# Patient Record
Sex: Male | Born: 1965 | Hispanic: No | Marital: Married
Health system: Southern US, Community
[De-identification: ages and names within clinical notes are randomized; demographics above are authoritative.]

---

## 2004-11-01 ENCOUNTER — Ambulatory Visit: Payer: Self-pay | Admitting: Internal Medicine

## 2004-11-01 IMAGING — US ABDOMEN ULTRASOUND
1 series · 14 of 25 positions shown · non-contrast
Comparison: none

REASON FOR EXAM: RUQ Pain
COMMENTS:

[Series 1: abdomen ultrasound · 0.39mm/px · 14 of 65 slices shown]
[im 1/65]
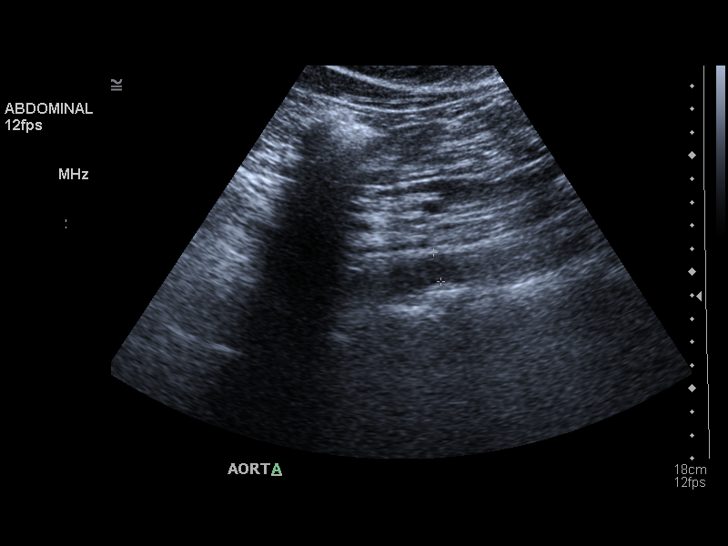
[im 6/65]
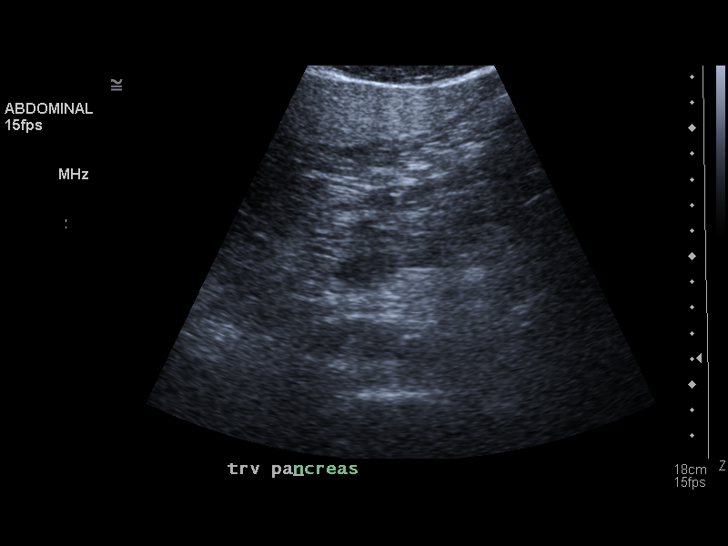
[im 11/65]
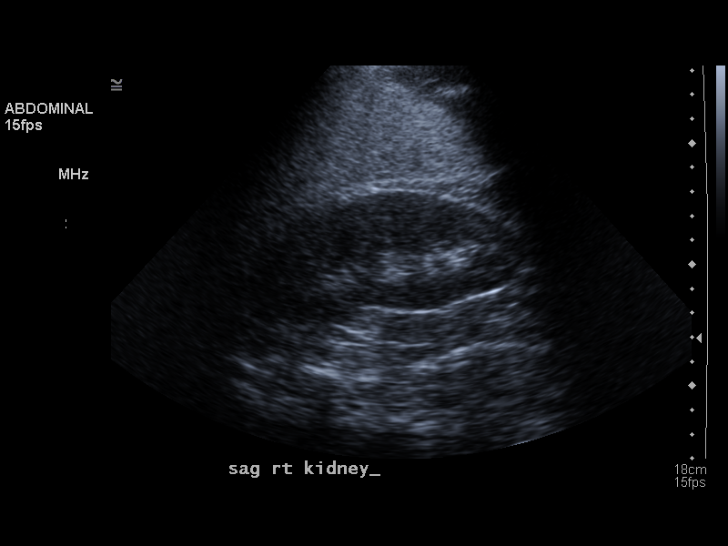
[im 17/65]
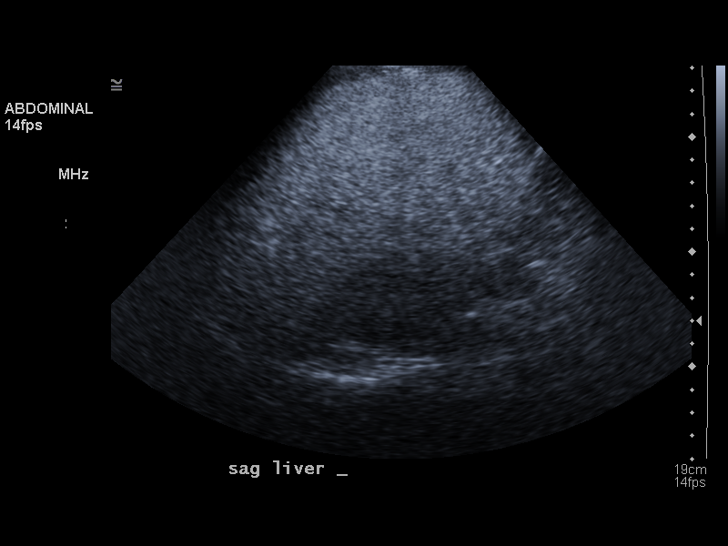
[im 22/65]
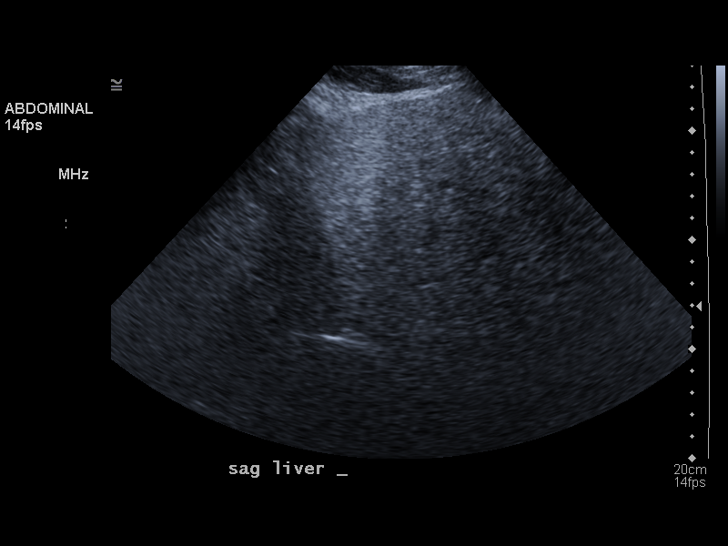
[im 25/65]
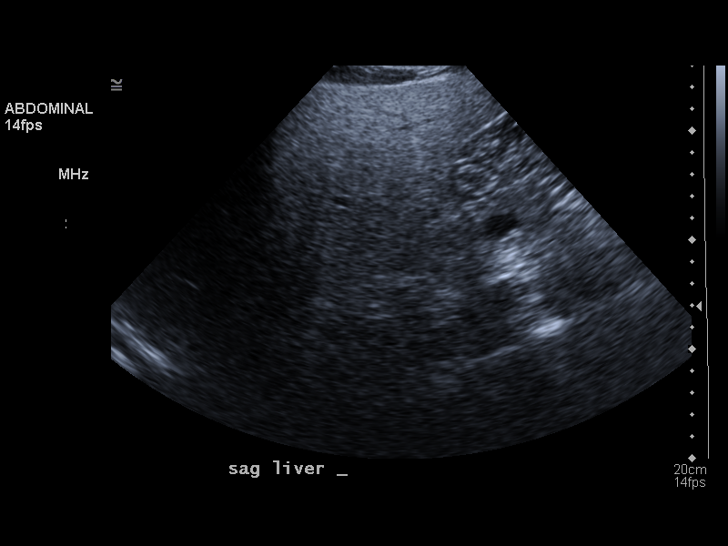
[im 30/65]
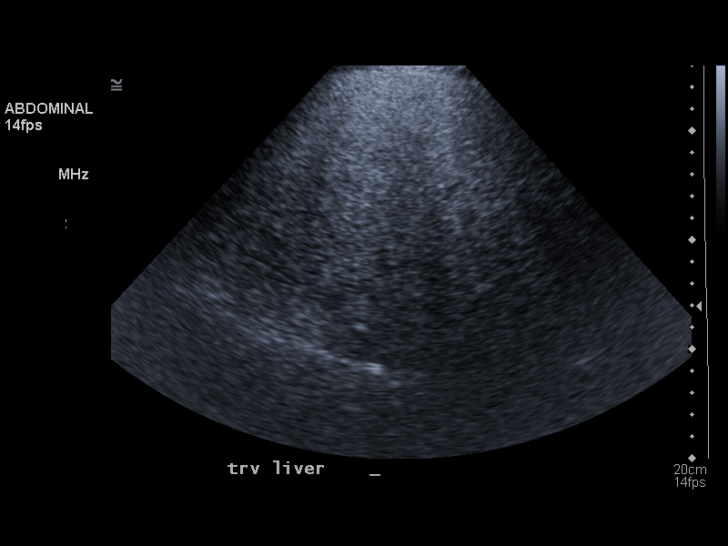
[im 35/65]
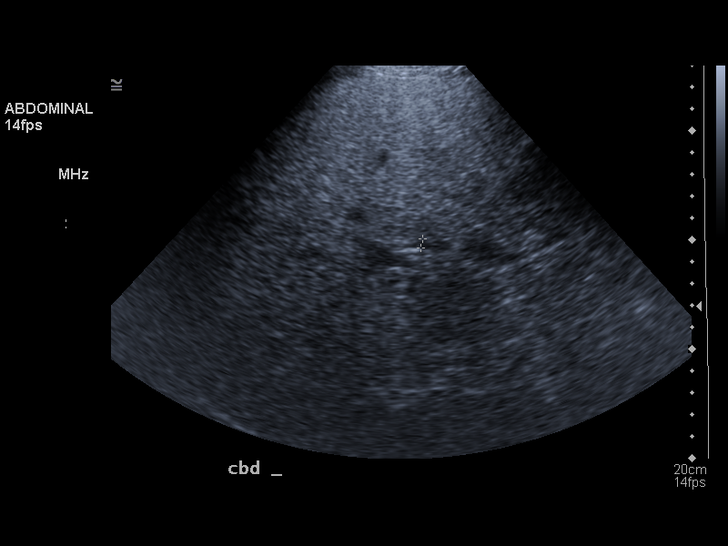
[im 41/65]
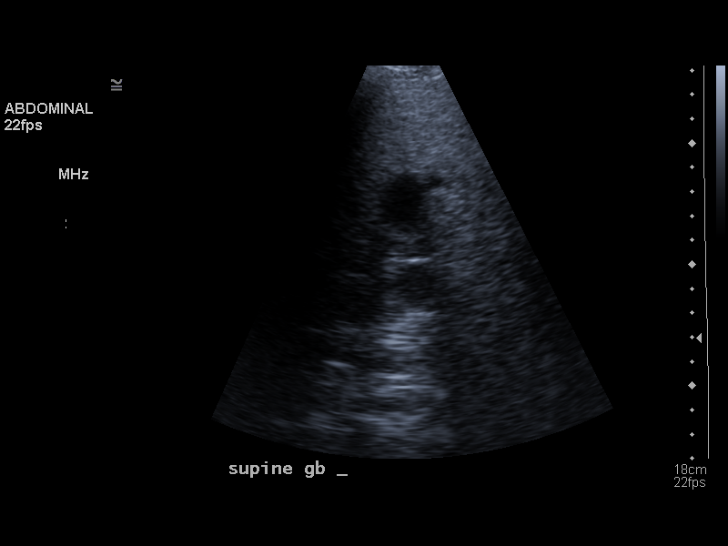
[im 43/65]
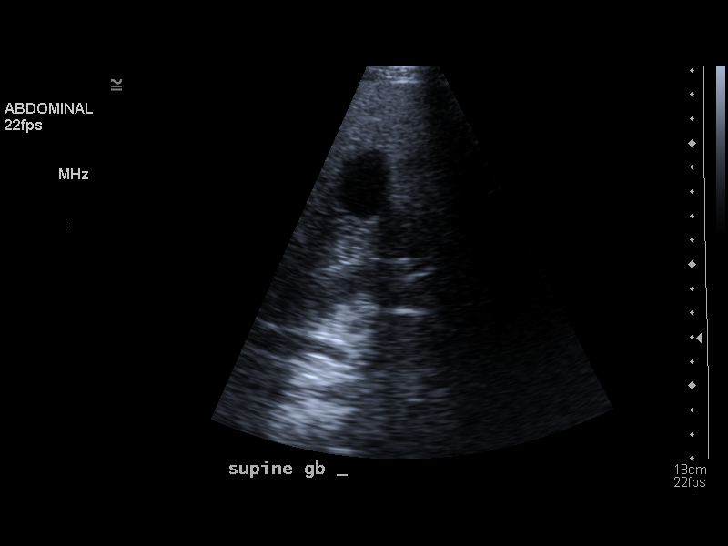
[im 49/65]
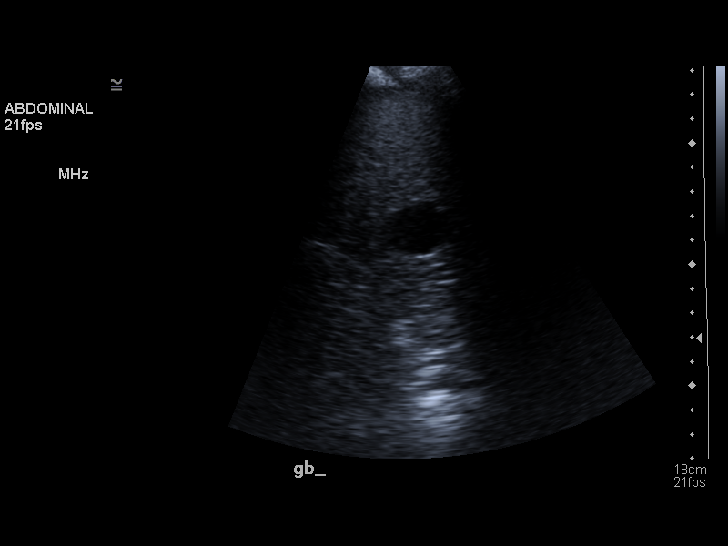
[im 54/65]
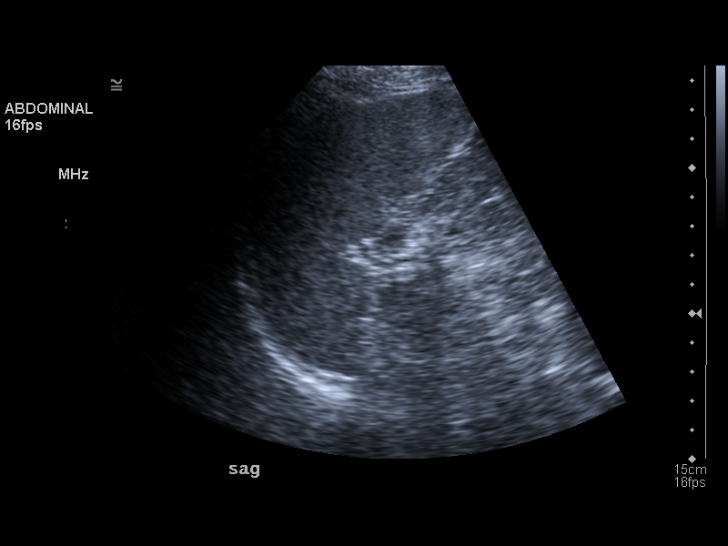
[im 59/65]
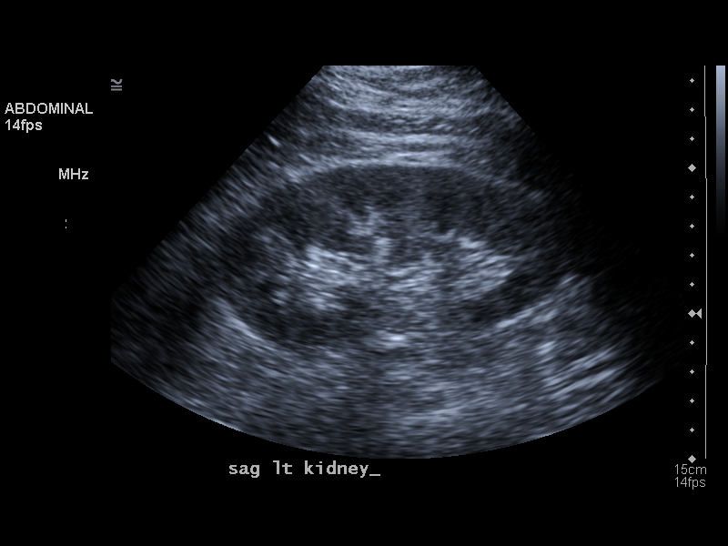
[im 65/65]
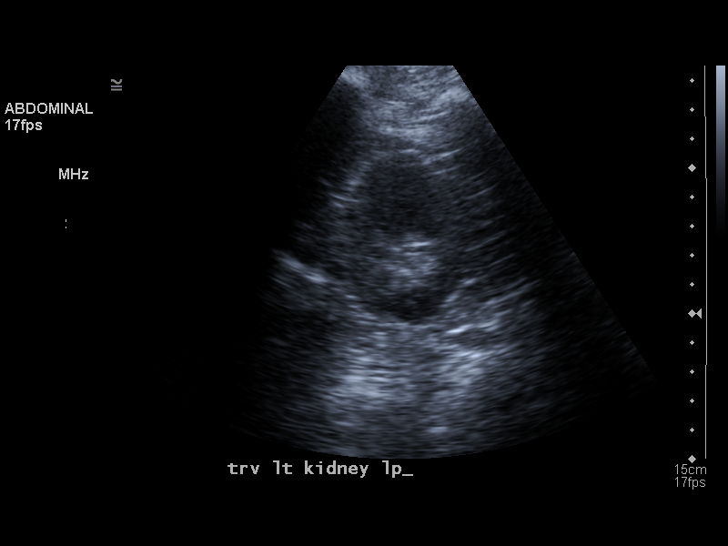

[14 of 25 positions shown; findings below may reference images not displayed]

PROCEDURE:     US  - US ABDOMEN GENERAL SURVEY  - [DATE]  [DATE]

RESULT:     The liver shows increased echogenicity consistent with fatty
infiltration. The spleen size is normal. The pancreatic tail is partially
obscured but the pancreas otherwise is normal in appearance. No gallstones
are seen. There is no thickening of the gallbladder wall. The common bile
duct measures 4.2 mm in diameter which is within normal limits. The kidneys
show no hydronephrosis. There is no ascites.
IMPRESSION: 1.     The liver shows increased echogenicity consistent with fatty
infiltration.
2.     No gallstones are seen.
3.     The pancreas is not optimally visualized in this exam but the
visualized portions are normal in appearance.

## 2008-12-31 ENCOUNTER — Ambulatory Visit: Payer: Self-pay | Admitting: General Surgery

## 2009-01-08 ENCOUNTER — Ambulatory Visit: Payer: Self-pay | Admitting: General Surgery

## 2010-11-02 ENCOUNTER — Emergency Department: Payer: Self-pay | Admitting: Emergency Medicine

## 2010-11-02 IMAGING — US ABDOMEN ULTRASOUND
1 series · 13 of 25 positions shown · non-contrast
Comparison: none

REASON FOR EXAM: epigastric pain
COMMENTS:   LMP: (Male)

[Series 1: abdomen ultrasound · 0.41mm/px · 13 of 88 slices shown]
[im 1/88]
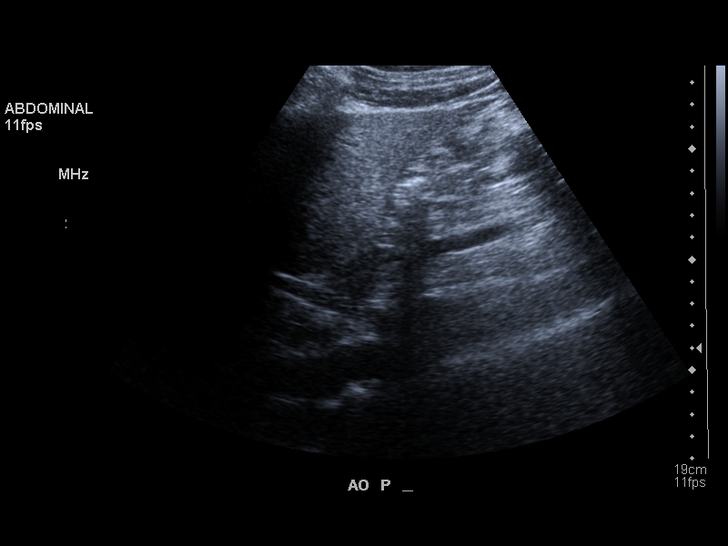
[im 8/88]
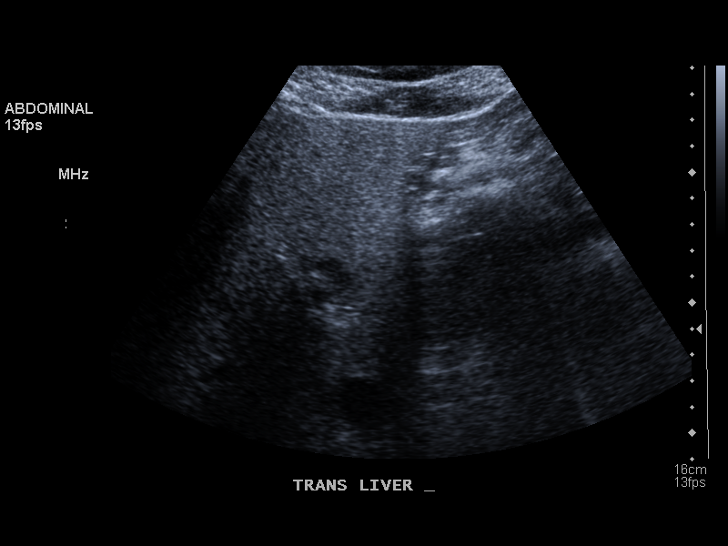
[im 15/88]
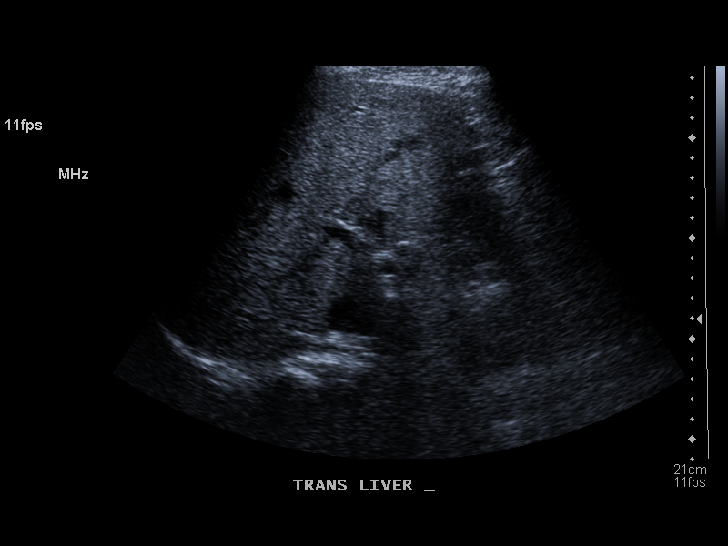
[im 22/88]
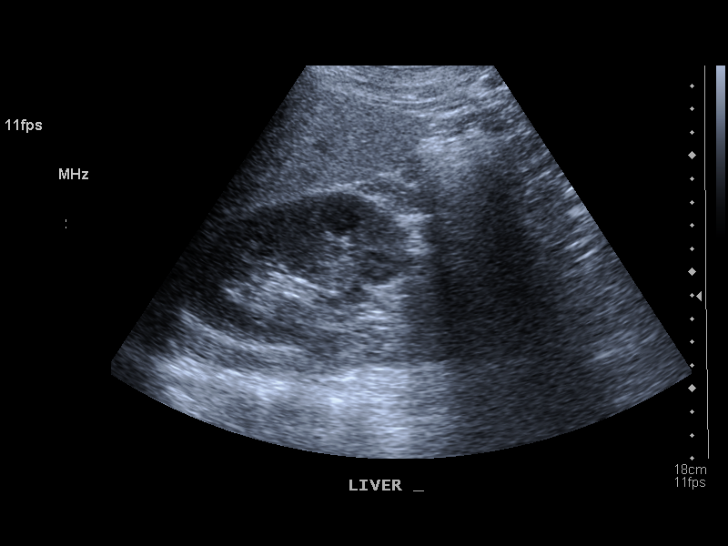
[im 30/88]
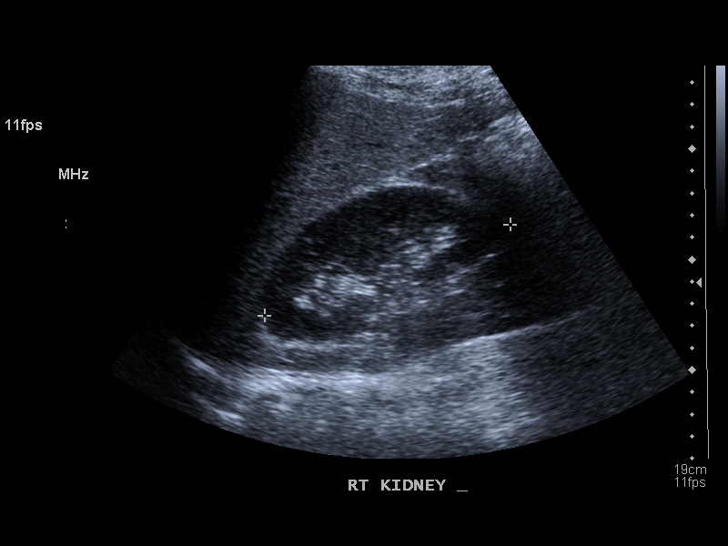
[im 37/88]
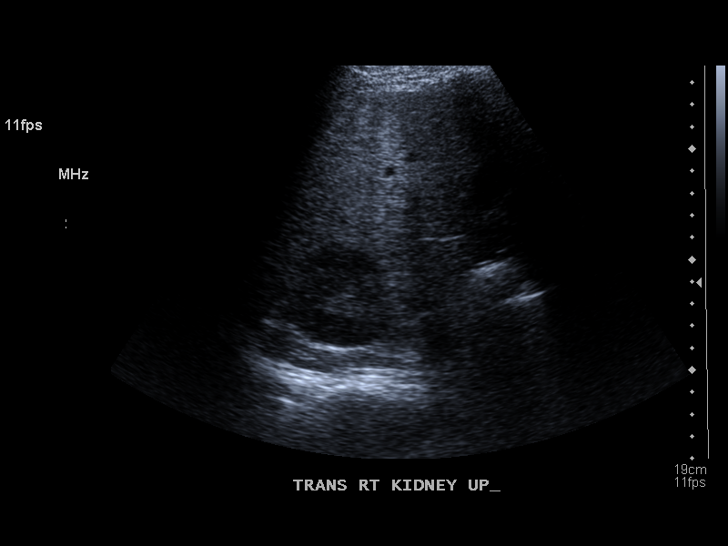
[im 44/88]
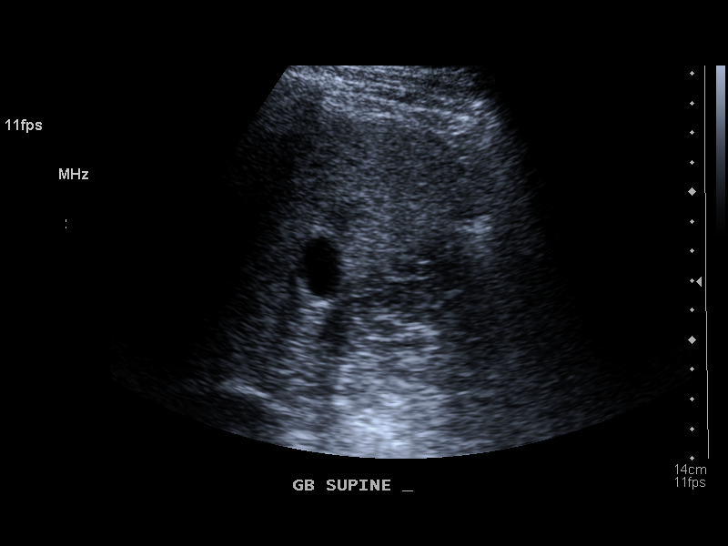
[im 51/88]
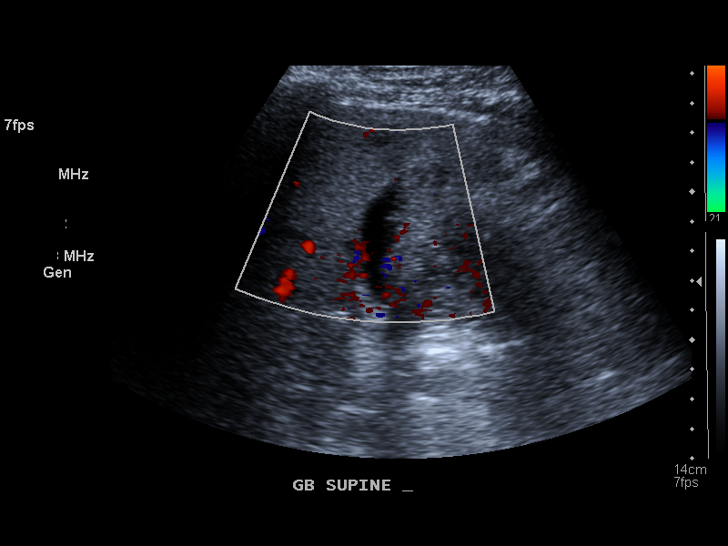
[im 59/88]
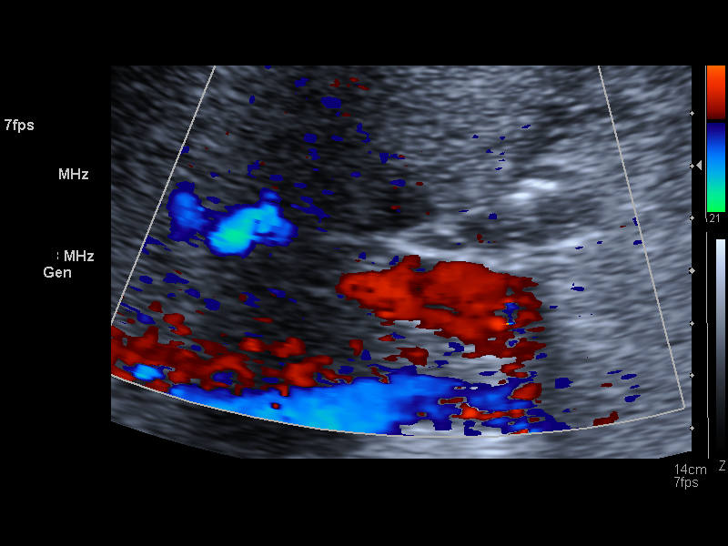
[im 66/88]
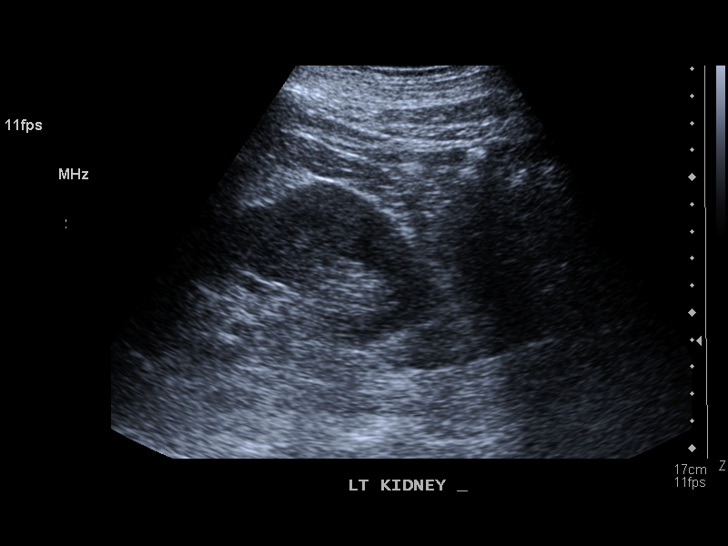
[im 73/88]
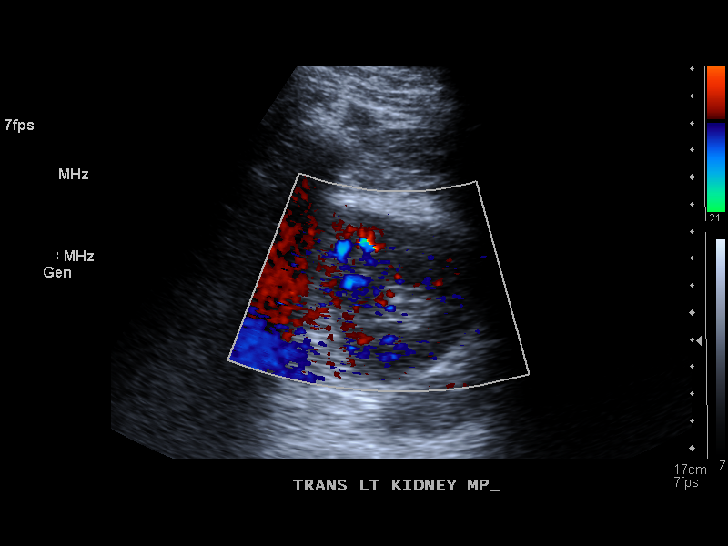
[im 80/88]
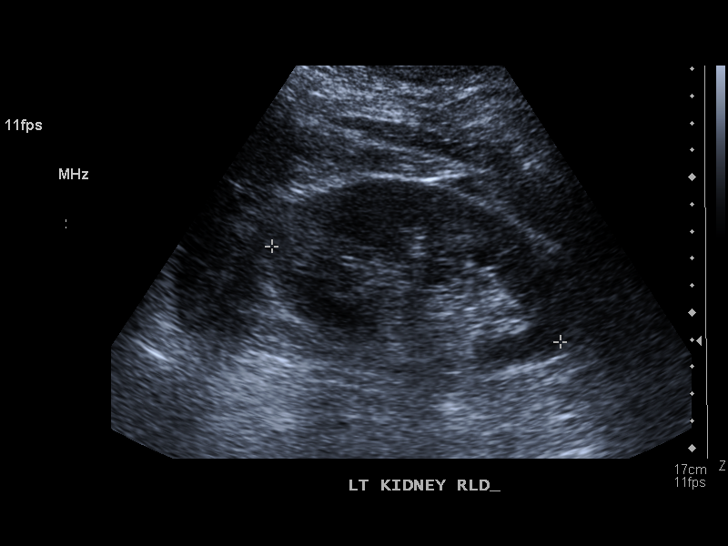
[im 88/88]
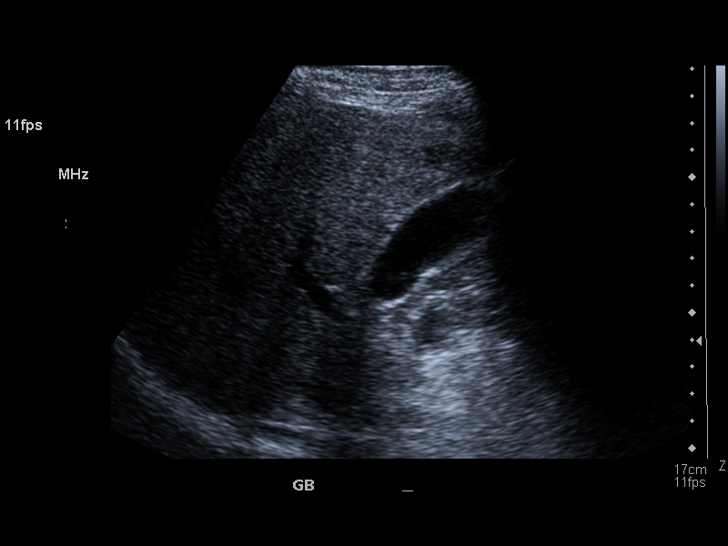

[13 of 25 positions shown; findings below may reference images not displayed]

PROCEDURE:     US  - US ABDOMEN GENERAL SURVEY  - [DATE]  [DATE]

RESULT:     The liver is hyperechogenic, suspicious for infiltration. No
focal hepatic mass lesions are seen. The pancreas is not visualized
adequately for evaluation on this exam. The abdominal aorta is for the most
part obscured. The inferior vena cava shows no significant abnormalities.
Spleen size is normal. No gallstones are seen. There is no thickening of the
gallbladder wall. There is a nonmobile nonshadowing echodensity associated
with the gallbladder wall and likely representing a gallbladder polyp that
measures 6.1 mm in diameter. There is no thickening of the gallbladder wall.
The common bile duct measures 3.1 mm in diameter which is within normal
limits. The kidneys show no hydronephrosis. Sagittally, the right kidney
measures 12.82 cm and the left measures 11.03 cm. No ascites is seen.
IMPRESSION: 1. No gallstones are seen.
2. Probable gallbladder polyp measuring 6.1 mm at maximum diameter.
3. The pancreas and much of the abdominal aorta are obscured by bowel gas
and not visualized adequately for evaluation on this exam.

## 2015-12-27 ENCOUNTER — Telehealth: Payer: Self-pay | Admitting: Cardiovascular Disease

## 2015-12-27 NOTE — Telephone Encounter (Signed)
Patient says he is currently taking Norvasc 10 mg daily po ; Pravastatin  10 mg po daily diovan 320 mg po daily  and asa 325 po daily

## 2015-12-27 NOTE — Telephone Encounter (Signed)
Patient called to schedule new patient ph appt with Dr. Mariah Millinggollan .  Patient says he is a friend of Erline HauChap Mcqueen and was told by him to see Dr. Mariah MillingGollan asap.  Scheduled patient for new patient appt on Wednesday at 2 pm but patient wanted to ask if he can be worked in any sooner.  Requested patient come by the office to fill out roi to get hospital records from hospital and from pcp.  Patient says he was seen for low na+ and tia.  Patient also c/o htn .  BP is 155/101.

## 2015-12-28 NOTE — Telephone Encounter (Signed)
Pt called back to cancel appt.  He advised scheduling that he has a "friend who is a cardiologist in [place] that can see him sooner".

## 2015-12-28 NOTE — Telephone Encounter (Signed)
Attempted to contact pt.  No answer, voice mail box is full.  

## 2015-12-28 NOTE — Telephone Encounter (Signed)
Can mandi call to get info

## 2015-12-29 ENCOUNTER — Ambulatory Visit: Payer: Self-pay | Admitting: Cardiovascular Disease

## 2017-01-03 ENCOUNTER — Other Ambulatory Visit: Payer: Self-pay | Admitting: Unknown Physician Specialty

## 2017-01-03 DIAGNOSIS — R059 Cough, unspecified: Secondary | ICD-10-CM

## 2017-01-03 DIAGNOSIS — R05 Cough: Secondary | ICD-10-CM

## 2017-01-04 ENCOUNTER — Ambulatory Visit
Admission: RE | Admit: 2017-01-04 | Discharge: 2017-01-04 | Disposition: A | Discharge status: Home / Self Care | Payer: BLUE CROSS/BLUE SHIELD | Source: Ambulatory Visit | Attending: Unknown Physician Specialty | Admitting: Unknown Physician Specialty

## 2017-01-04 DIAGNOSIS — R05 Cough: Secondary | ICD-10-CM

## 2017-01-04 DIAGNOSIS — R059 Cough, unspecified: Secondary | ICD-10-CM

## 2017-01-04 IMAGING — CR DG CHEST 2V
1 series · 2 of 2 positions shown · non-contrast
Comparison: None.

CLINICAL DATA: Cough

EXAM:
CHEST  2 VIEW

[Series 1: dg chest 2 view · 0.14mm/px · 2 of 2 slices shown]
[im 1/2]
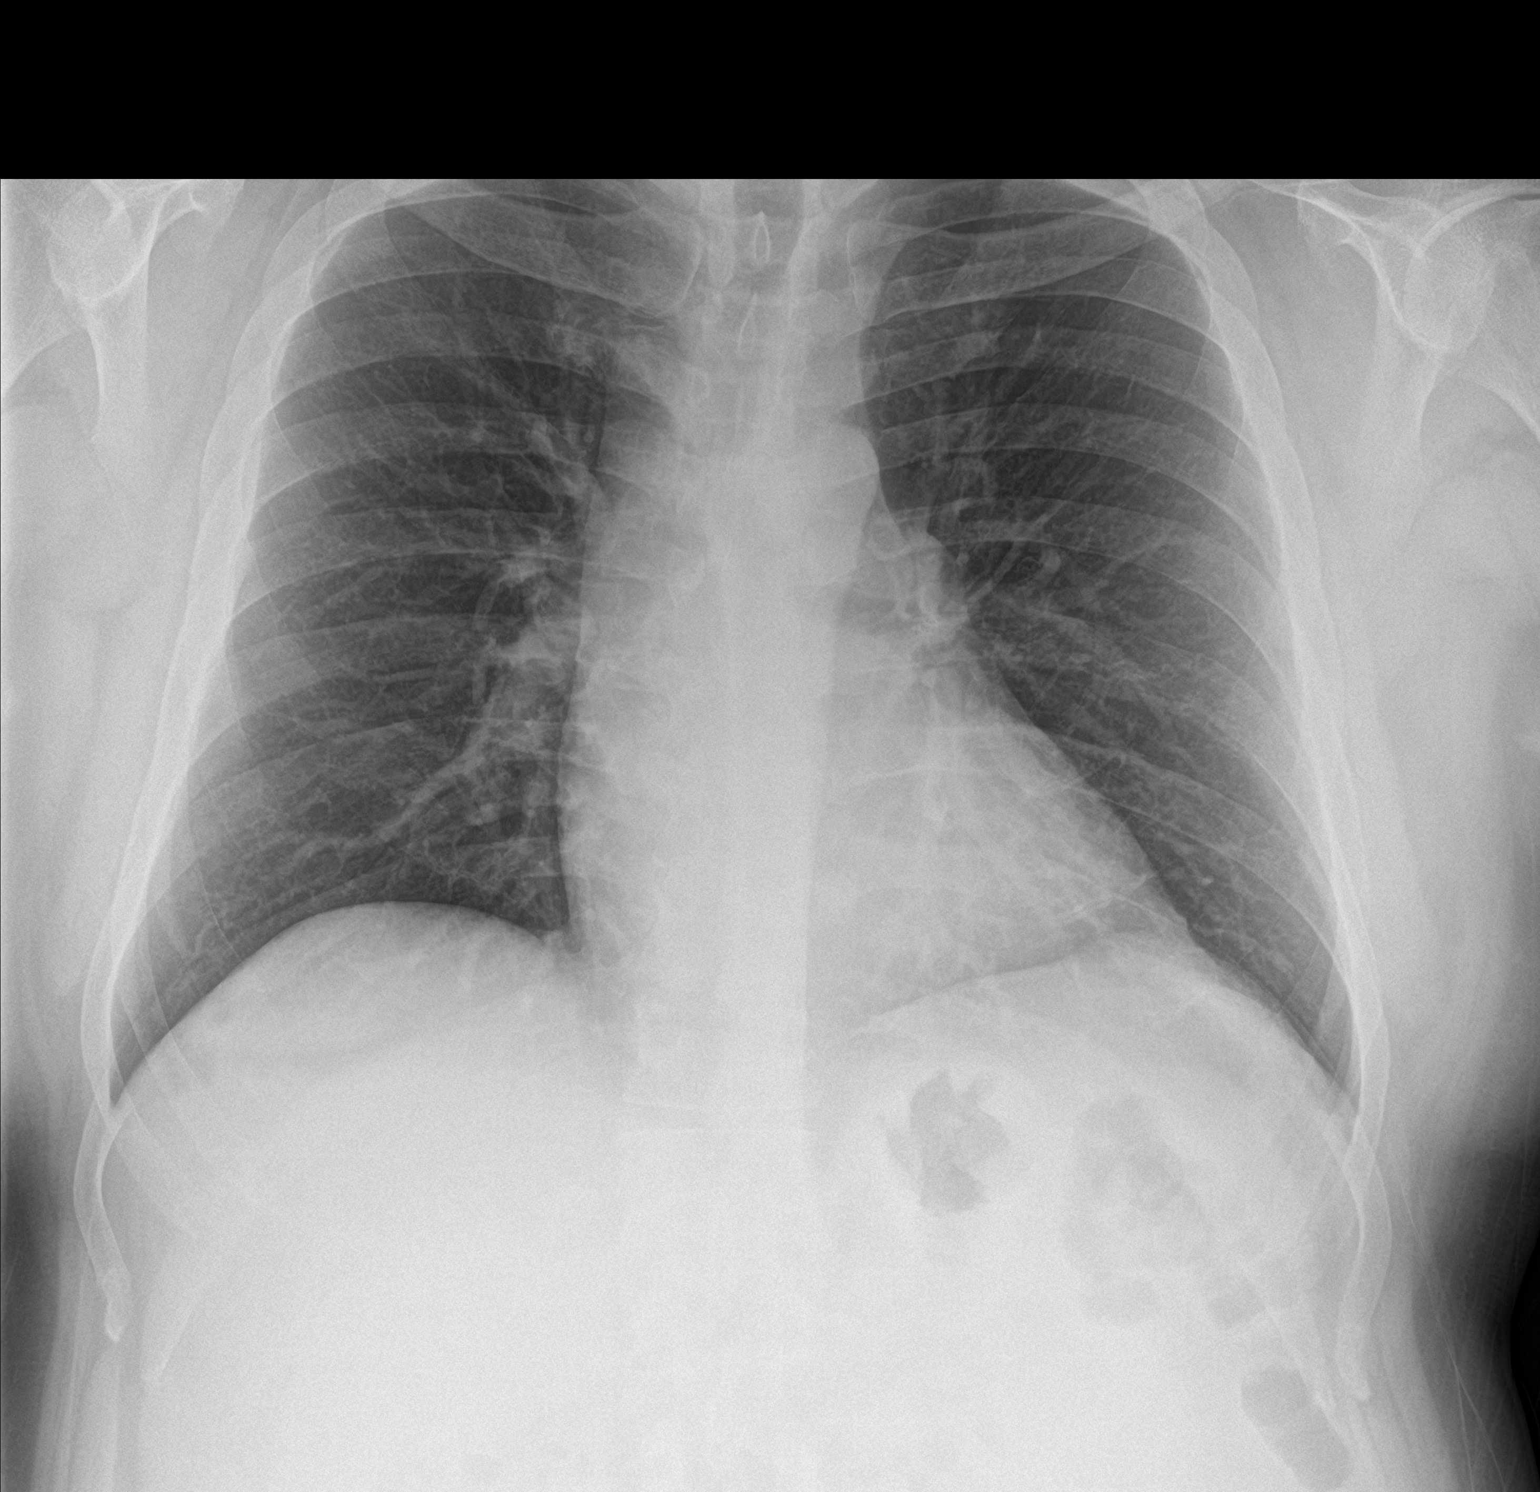
[im 2/2]
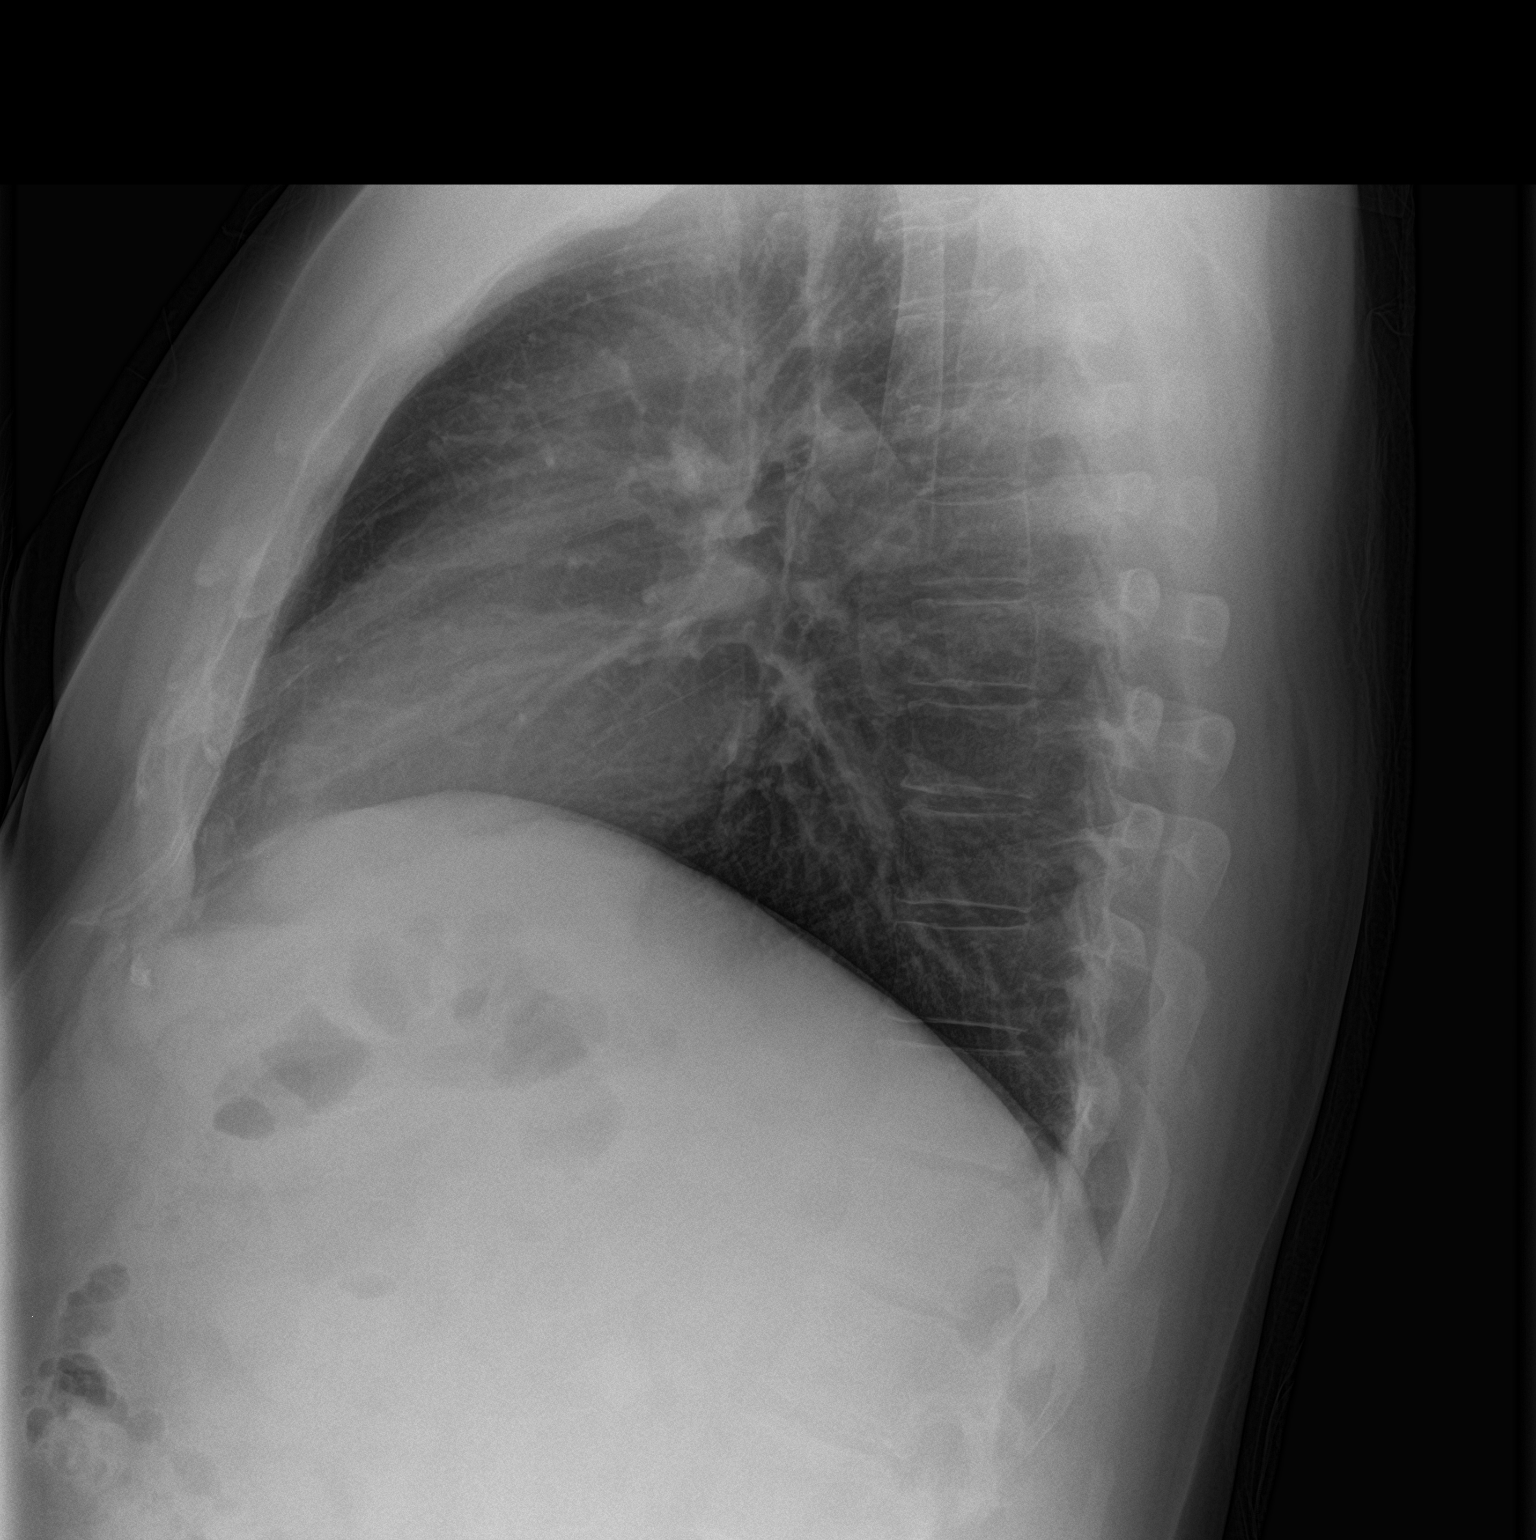

[2 of 2 positions shown; findings below may reference images not displayed]

FINDINGS: The heart size and mediastinal contours are within normal limits.
Both lungs are clear. The visualized skeletal structures are
unremarkable.
IMPRESSION: No active cardiopulmonary disease.

## 2020-10-05 ENCOUNTER — Other Ambulatory Visit: Payer: Self-pay | Admitting: Unknown Physician Specialty

## 2020-10-05 ENCOUNTER — Other Ambulatory Visit: Payer: Self-pay

## 2020-10-05 ENCOUNTER — Ambulatory Visit
Admission: RE | Admit: 2020-10-05 | Discharge: 2020-10-05 | Disposition: A | Discharge status: Home / Self Care | Payer: BC Managed Care – PPO | Source: Ambulatory Visit | Attending: Unknown Physician Specialty | Admitting: Unknown Physician Specialty

## 2020-10-05 ENCOUNTER — Ambulatory Visit
Admission: RE | Admit: 2020-10-05 | Discharge: 2020-10-05 | Disposition: A | Discharge status: Home / Self Care | Payer: BC Managed Care – PPO | Attending: Unknown Physician Specialty | Admitting: Unknown Physician Specialty

## 2020-10-05 DIAGNOSIS — R053 Chronic cough: Secondary | ICD-10-CM | POA: Insufficient documentation

## 2020-10-05 IMAGING — CR DG CHEST 2V
1 series · 2 of 2 positions shown · non-contrast
Comparison: [DATE].

CLINICAL DATA: Cough.

EXAM:
CHEST - 2 VIEW

[Series 1: dg chest 2 view · 0.14mm/px · 2 of 2 slices shown]
[im 1/2]
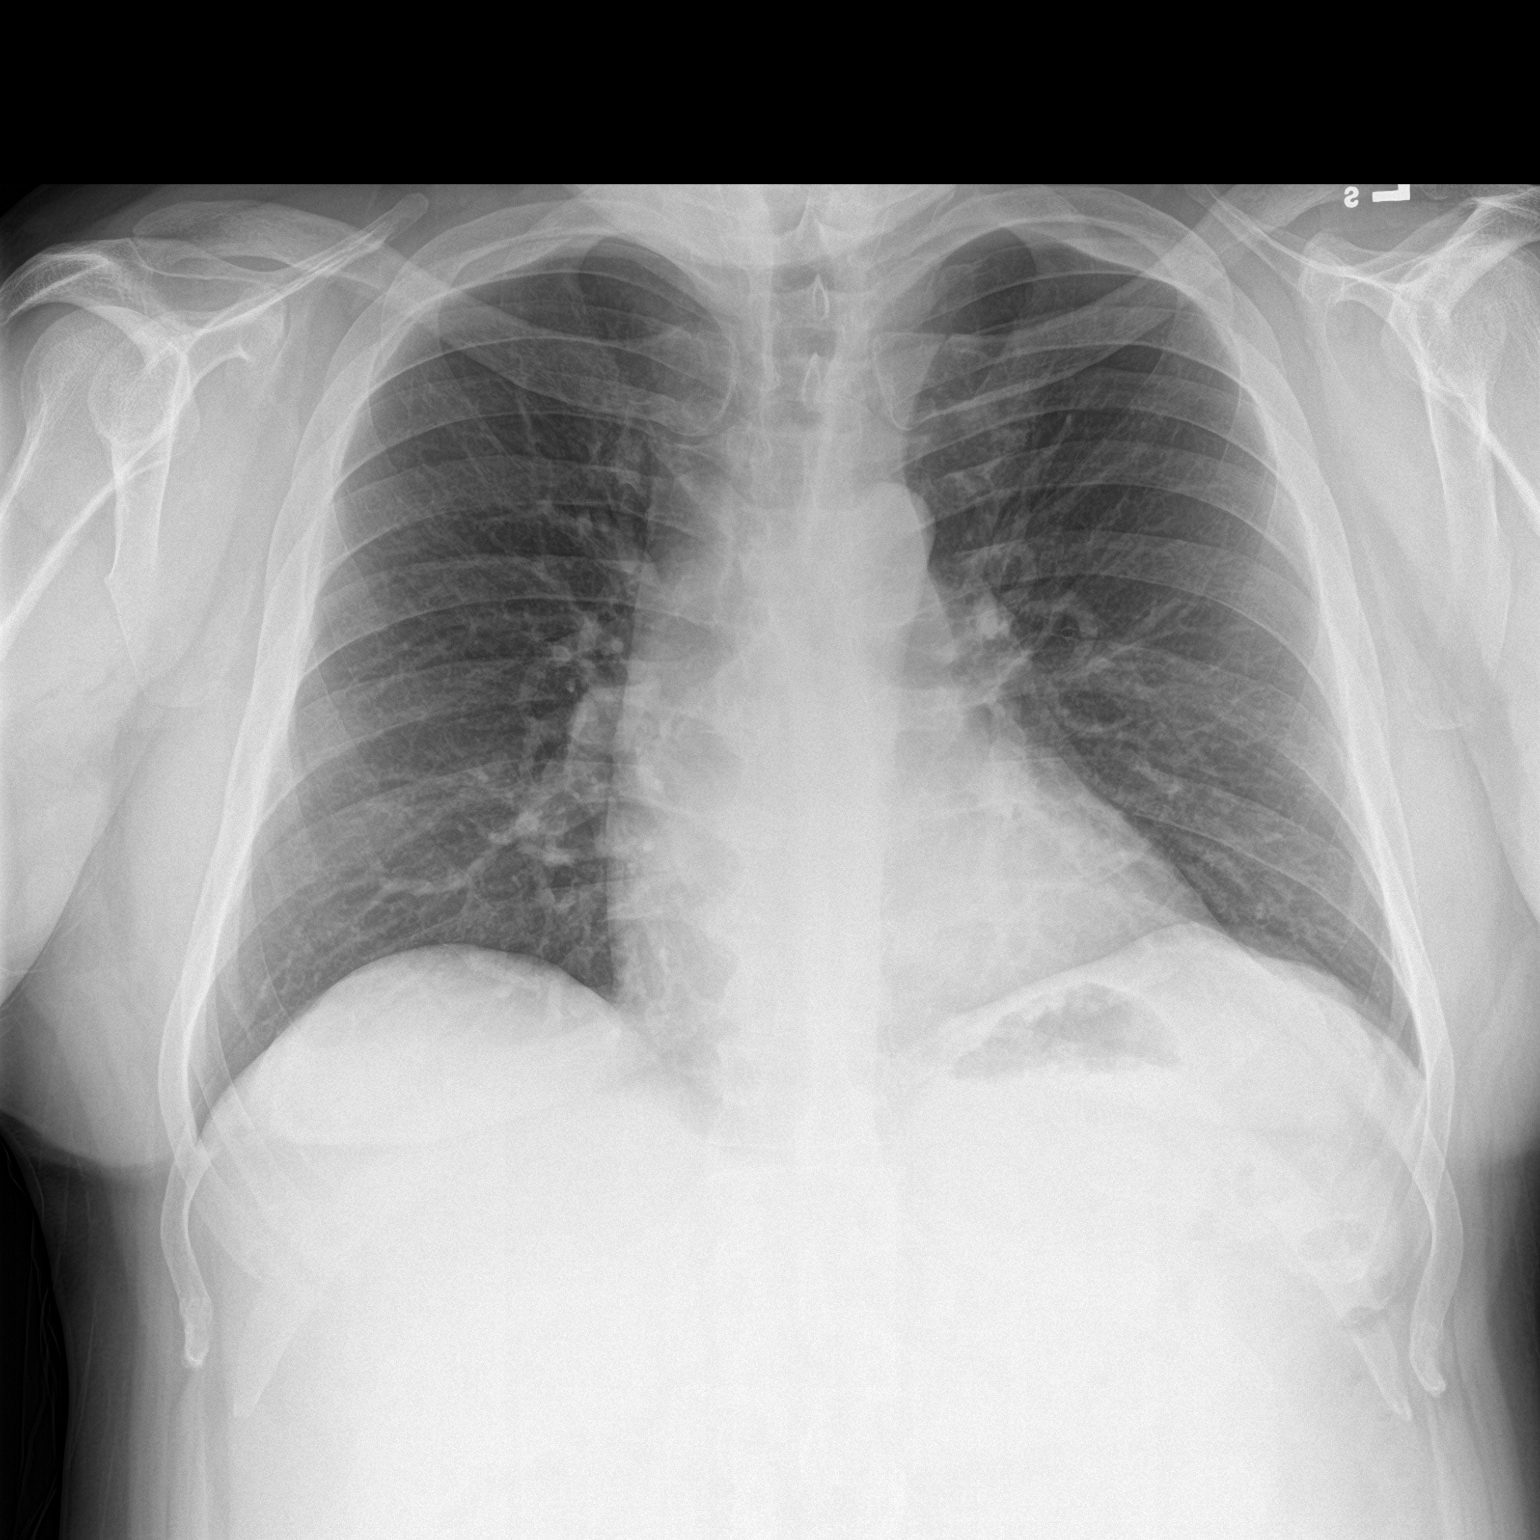
[im 2/2]
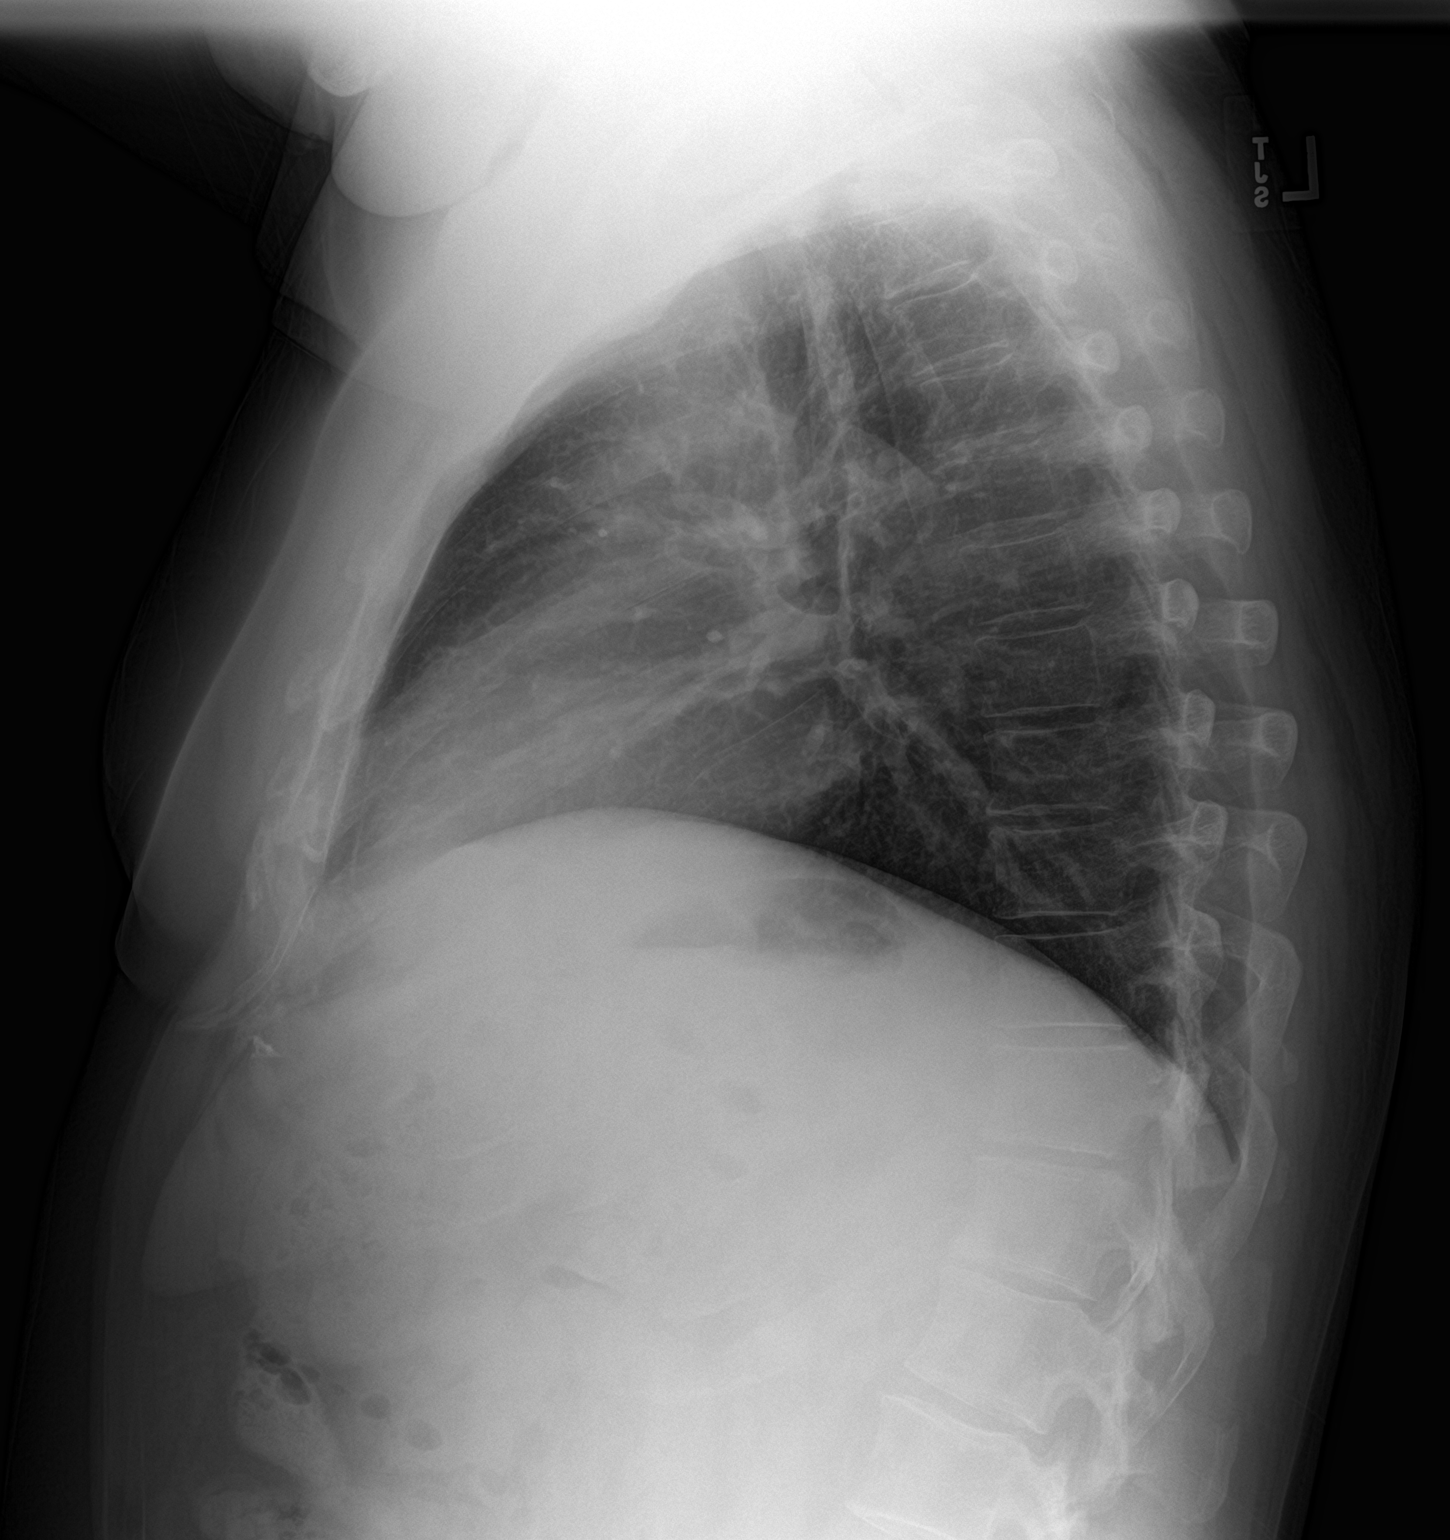

[2 of 2 positions shown; findings below may reference images not displayed]

FINDINGS: Mediastinum and hilar structures normal. Heart size normal. No focal
infiltrate. No pleural effusion or pneumothorax. Biapical pleural
thickening again noted consistent scarring. Degenerative change
thoracic spine.
IMPRESSION: No acute cardiopulmonary disease.

## 2021-11-19 ENCOUNTER — Emergency Department: Payer: BC Managed Care – PPO

## 2021-11-19 ENCOUNTER — Emergency Department
Admission: EM | Admit: 2021-11-19 | Discharge: 2021-11-20 | Disposition: A | Discharge status: Home / Self Care | Payer: BC Managed Care – PPO | Attending: Emergency Medicine | Admitting: Emergency Medicine

## 2021-11-19 ENCOUNTER — Other Ambulatory Visit: Payer: Self-pay

## 2021-11-19 DIAGNOSIS — S0990XA Unspecified injury of head, initial encounter: Secondary | ICD-10-CM | POA: Diagnosis present

## 2021-11-19 DIAGNOSIS — W01198A Fall on same level from slipping, tripping and stumbling with subsequent striking against other object, initial encounter: Secondary | ICD-10-CM | POA: Insufficient documentation

## 2021-11-19 DIAGNOSIS — Z7982 Long term (current) use of aspirin: Secondary | ICD-10-CM | POA: Diagnosis not present

## 2021-11-19 DIAGNOSIS — S0181XA Laceration without foreign body of other part of head, initial encounter: Secondary | ICD-10-CM

## 2021-11-19 DIAGNOSIS — Z23 Encounter for immunization: Secondary | ICD-10-CM | POA: Insufficient documentation

## 2021-11-19 IMAGING — CT CT HEAD W/O CM
4 series · 15 of 47 positions shown, 17 images · non-contrast
Comparison: None.

CLINICAL DATA: Recent fall with headaches and neck pain, initial
encounter



[Series 2: head wo · axial · 0.47mm/px · z∈[-116,+4]mm · 7 of 32 slices shown, 9 images]
[im 4/32  brain]
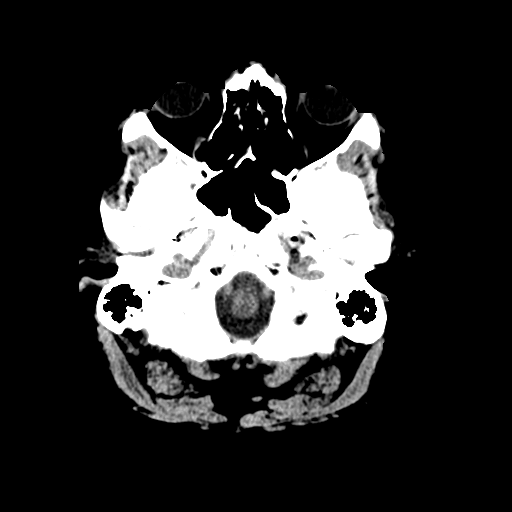
[im 4/32  bone]
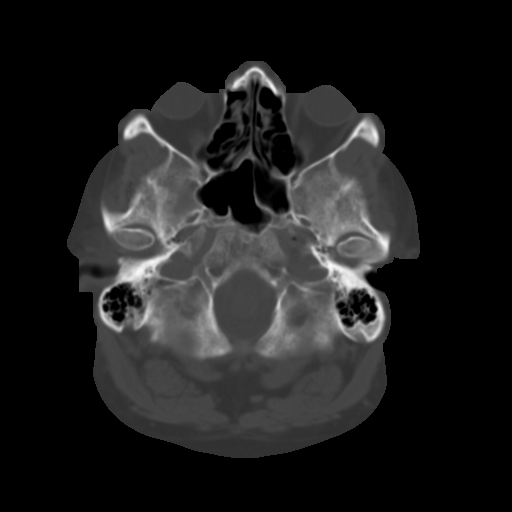
[im 8/32  brain]
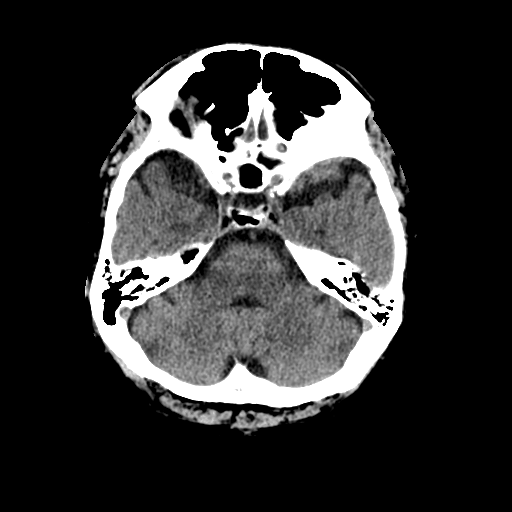
[im 12/32  brain]
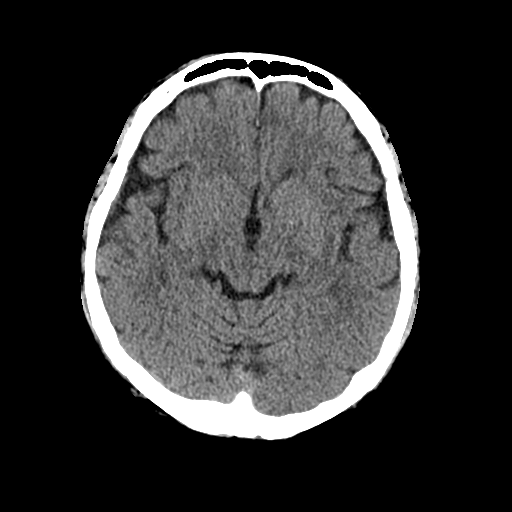
[im 16/32  brain]
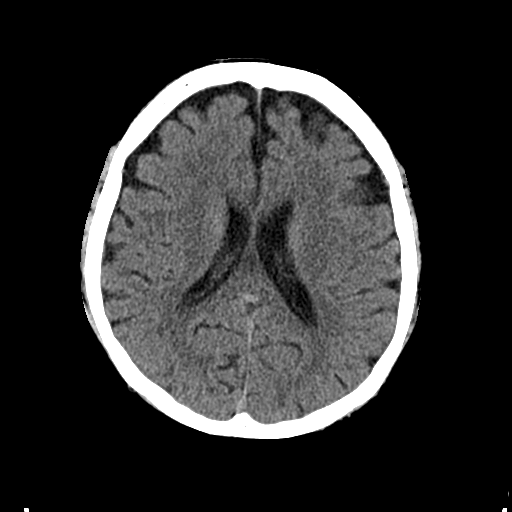
[im 20/32  brain]
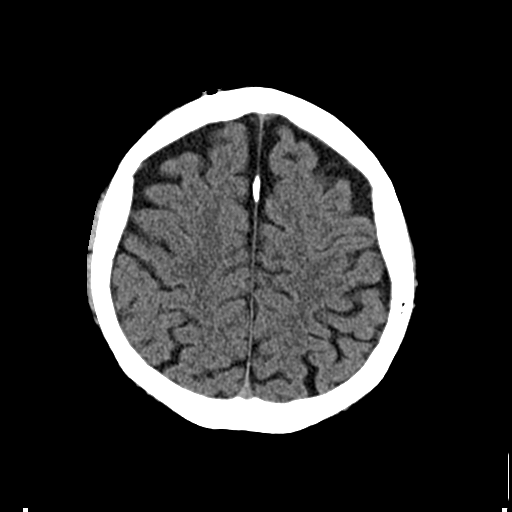
[im 20/32  bone]
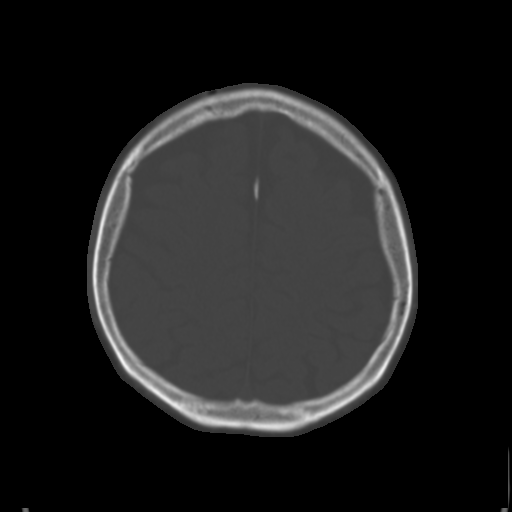
[im 24/32  brain]
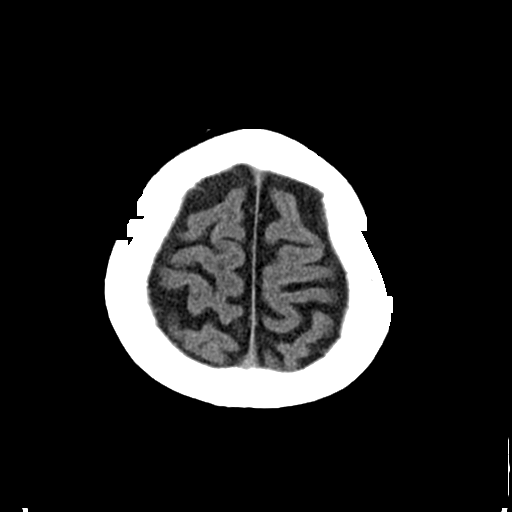
[im 28/32  brain]
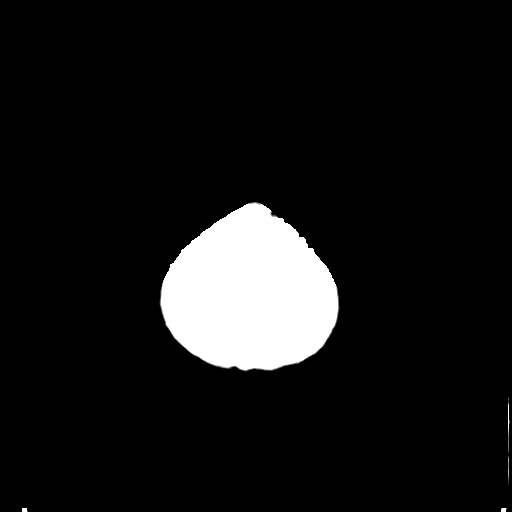

[Series 3: head bone · axial · 0.47mm/px · z∈[-117,-101]mm · 2 of 79 slices shown]
[im 8/79  bone]
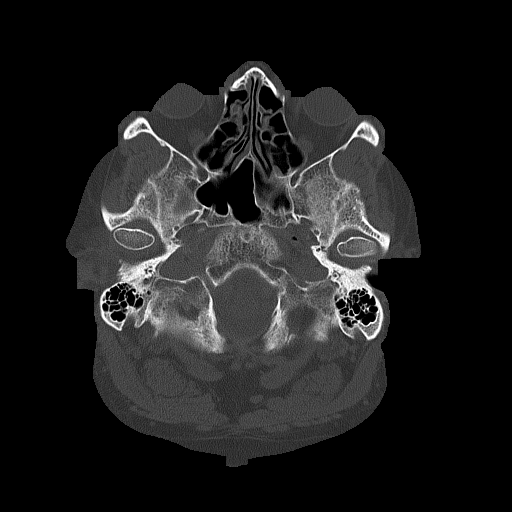
[im 16/79  bone]
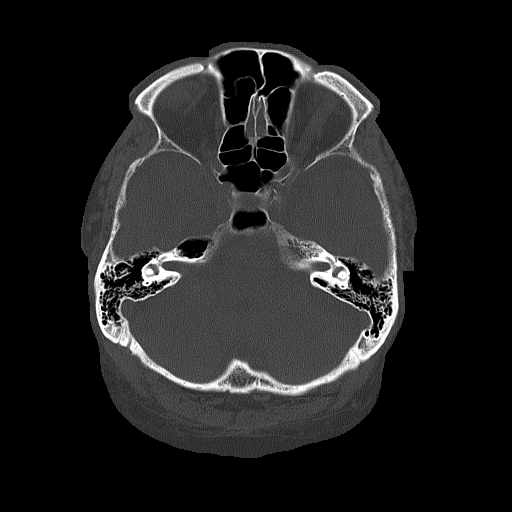

[Series 4: coronal soft tissue · coronal · 0.30mm/px · 3 of 71 slices shown]
[im 24/71  brain]
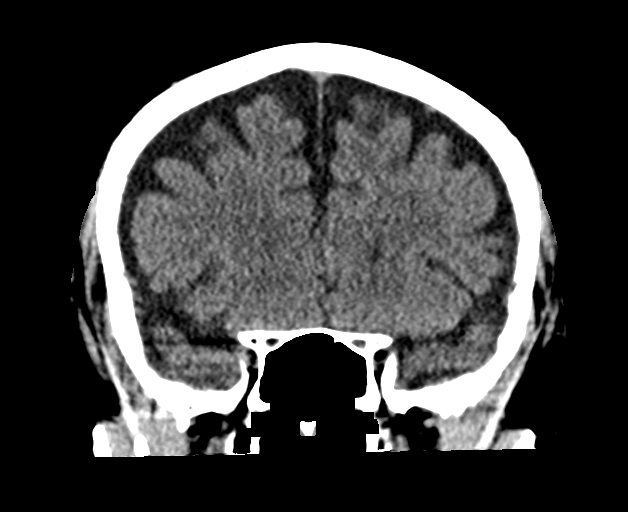
[im 32/71  brain]
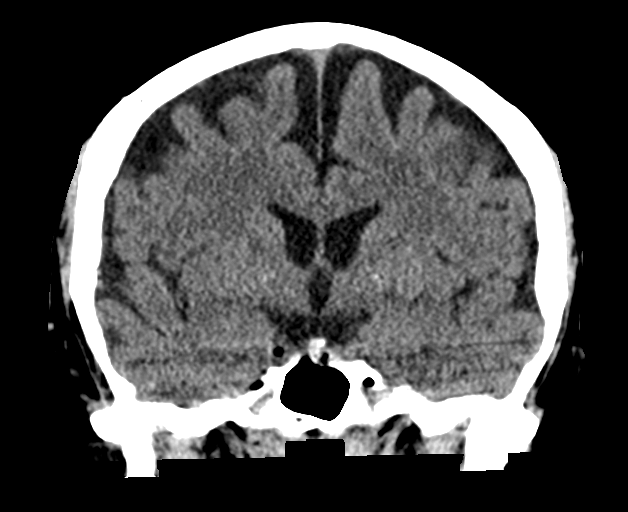
[im 39/71  brain]
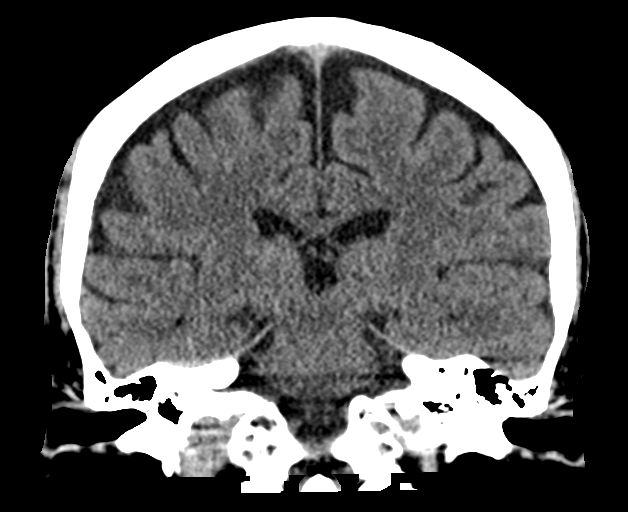

[Series 5: sagittal soft tissue · sagittal · 0.30mm/px · 3 of 64 slices shown]
[im 22/64  brain]
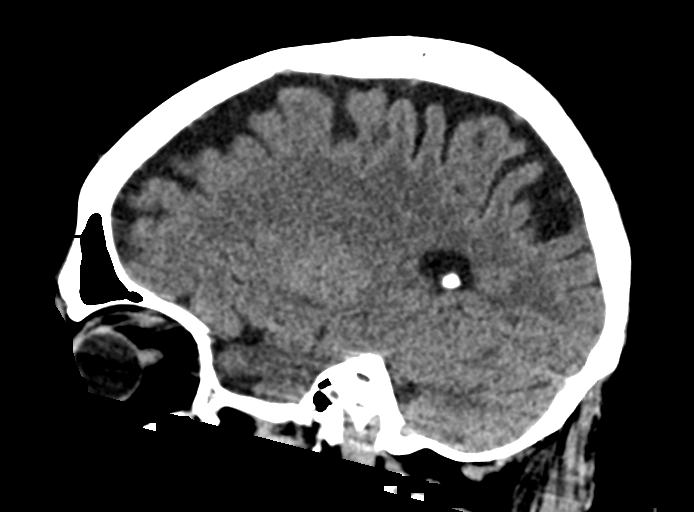
[im 32/64  brain]
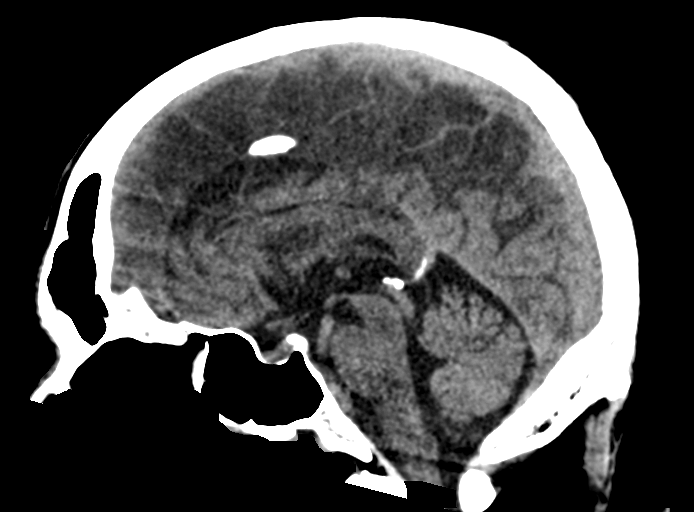
[im 43/64  brain]
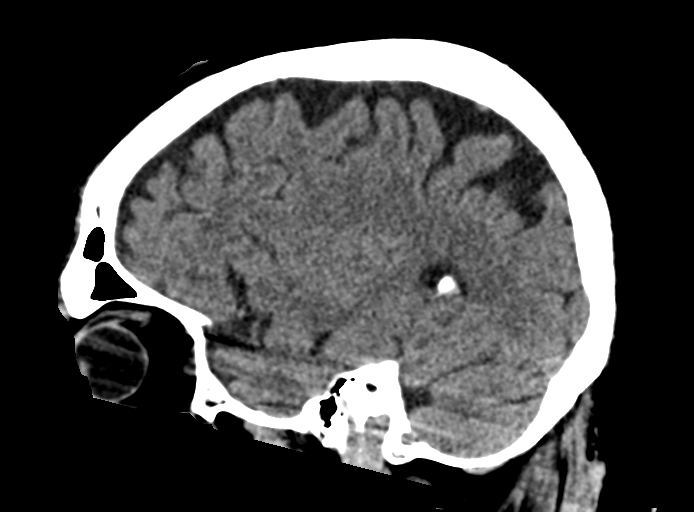

[15 of 47 positions shown; findings below may reference images not displayed]

FINDINGS: CT HEAD FINDINGS

Brain: No evidence of acute infarction, hemorrhage, hydrocephalus,
extra-axial collection or mass lesion/mass effect.

Vascular: No hyperdense vessel or unexpected calcification.

Skull: Normal. Negative for fracture or focal lesion.

Sinuses/Orbits: No acute finding.

Other: Soft tissue laceration is noted just to the right of the
midline in the upper forehead consistent with the given clinical
history.

CT CERVICAL SPINE FINDINGS

Alignment: Mild straightening of the normal cervical lordosis is
noted. This is likely related to muscular spasm.

Skull base and vertebrae: 7 cervical segments are well visualized.
Vertebral body height is well maintained. No acute fracture or acute
facet abnormality is noted. Mild osteophytic changes are noted at
C5-6.

Soft tissues and spinal canal: The surrounding soft tissue
structures are within normal limits.

Upper chest: Visualized lung apices are unremarkable.

Other: None
IMPRESSION: CT of the head: No acute intracranial abnormality noted.

Forehead laceration is noted just to the right of the midline.

CT of the cervical spine: Mild straightening of the normal cervical
lordosis is noted.

Mild degenerative changes are seen.  No acute fracture noted.

## 2021-11-19 IMAGING — CT CT CERVICAL SPINE W/O CM
3 of 4 series · 12 of 33 positions shown, 14 images · non-contrast
Comparison: None.

CLINICAL DATA: Recent fall with headaches and neck pain, initial
encounter



[Series 6: sagittal bone · sagittal · 0.28mm/px · 5 of 74 slices shown, 6 images]
[im 25/74  bone]
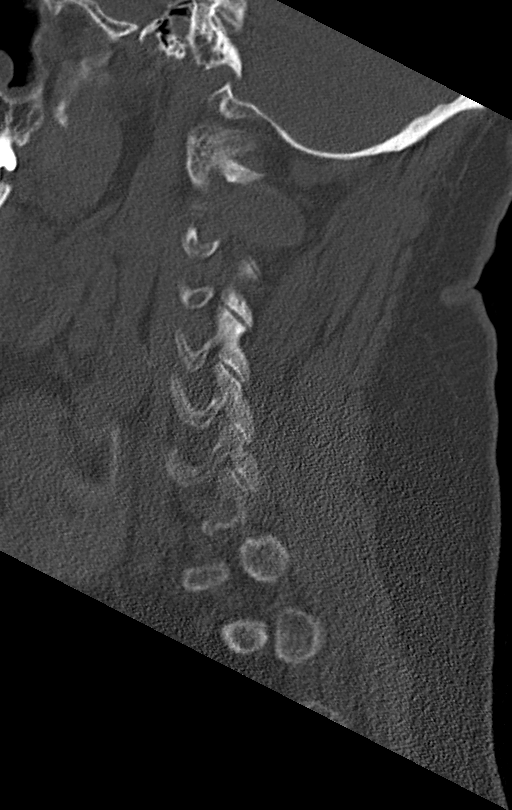
[im 31/74  bone]
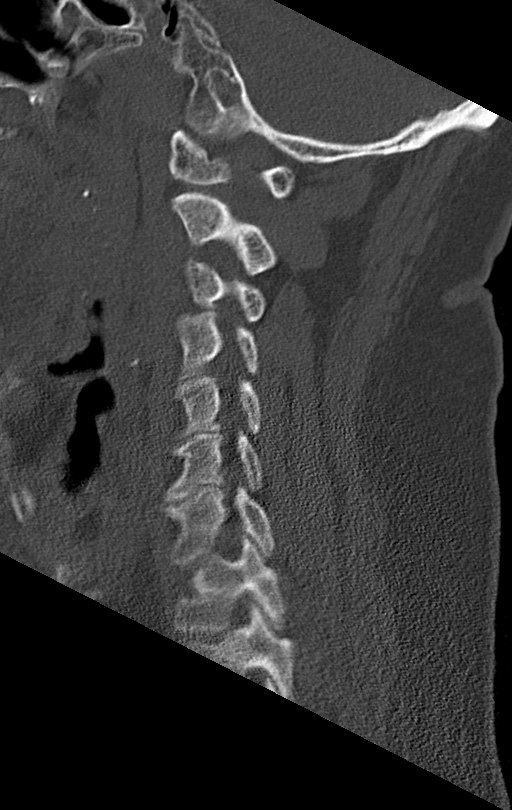
[im 37/74  soft-tissue]
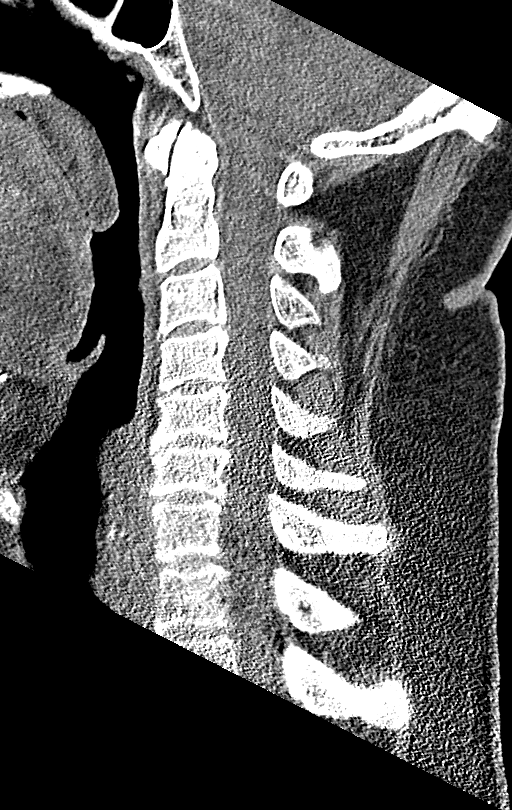
[im 37/74  bone]
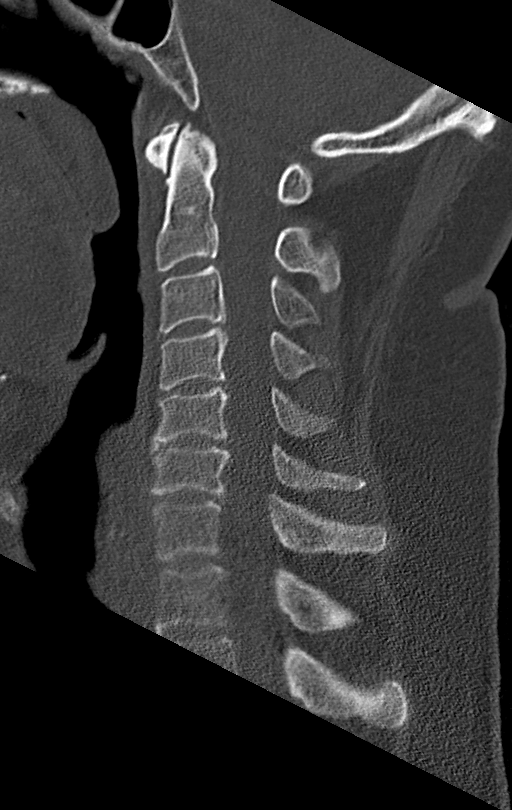
[im 43/74  bone]
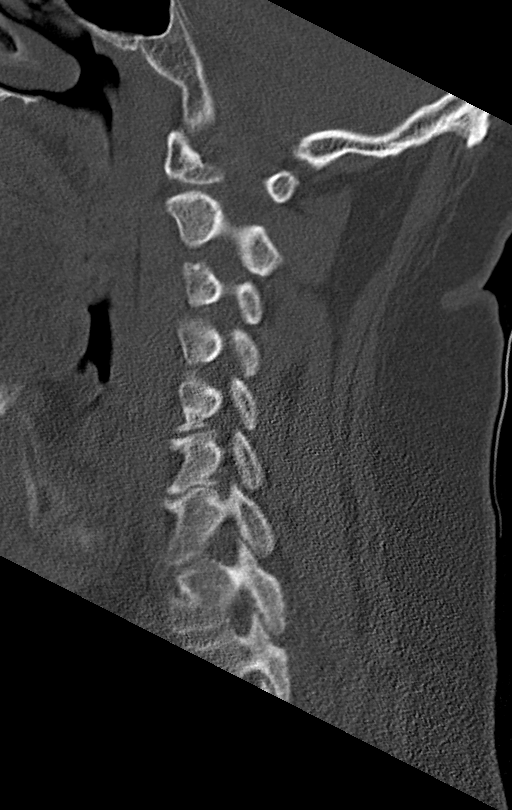
[im 49/74  bone]
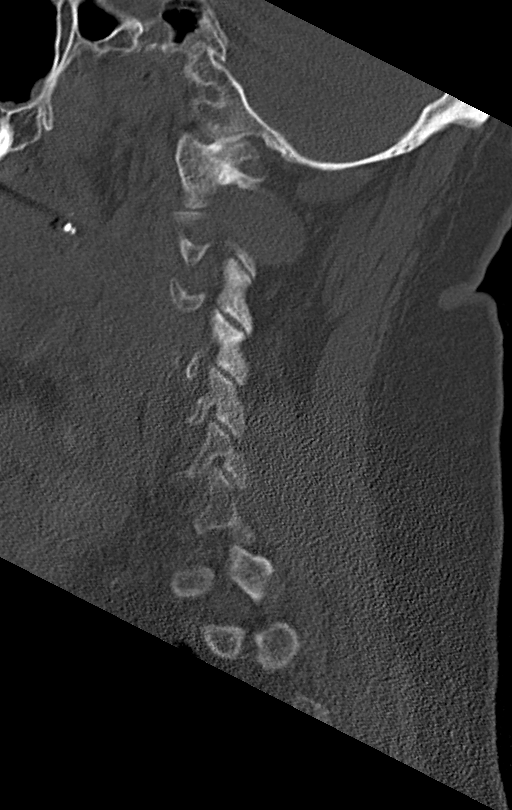

[Series 7: coronal bone · coronal · 0.29mm/px · 3 of 71 slices shown]
[im 19/71  bone]
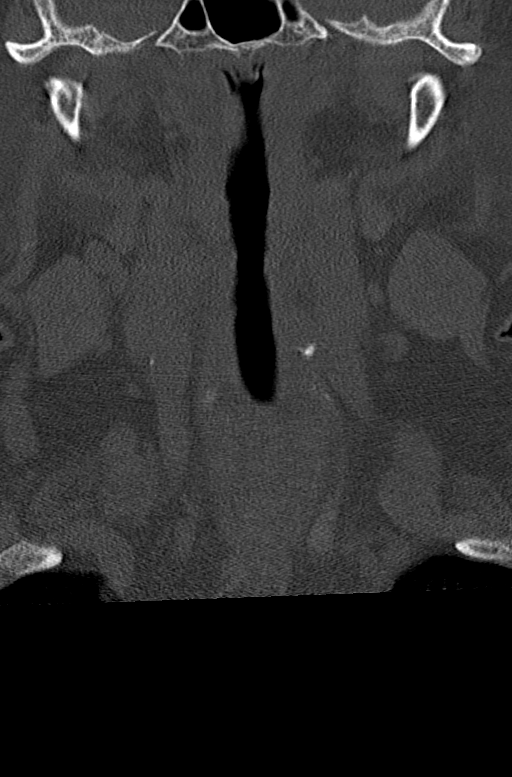
[im 30/71  bone]
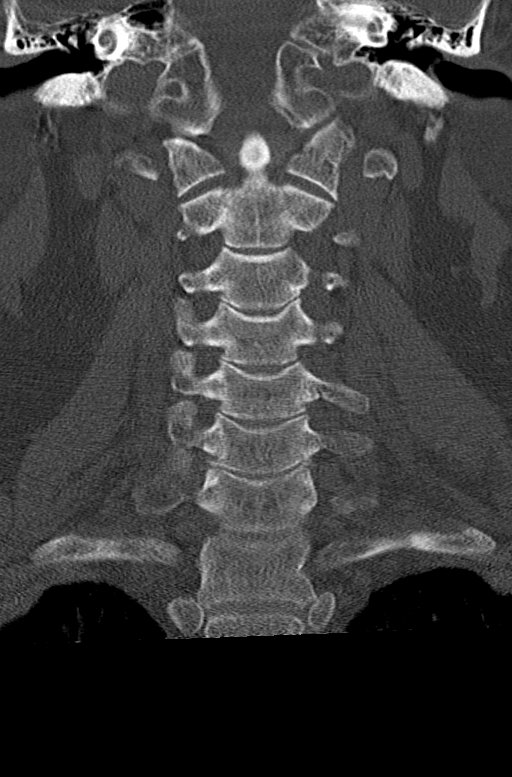
[im 41/71  bone]
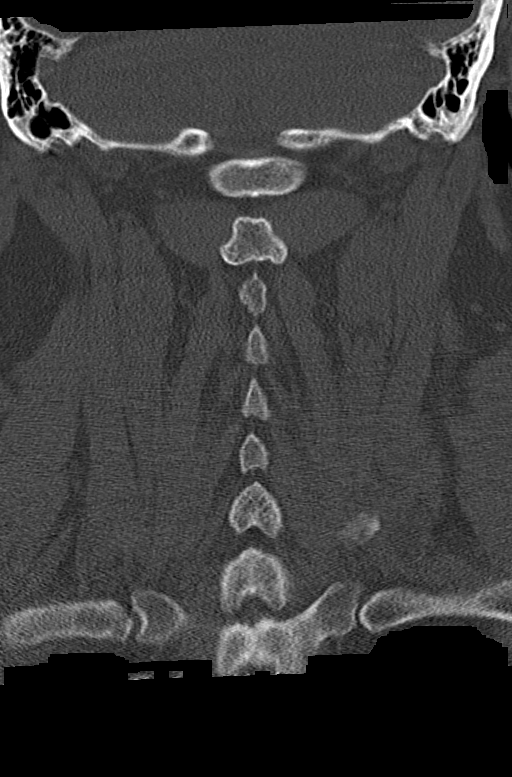

[Series 8: orthogonal bone · axial · 0.28mm/px · z∈[-307,-165]mm · 4 of 112 slices shown, 5 images]
[im 16/112  soft-tissue]
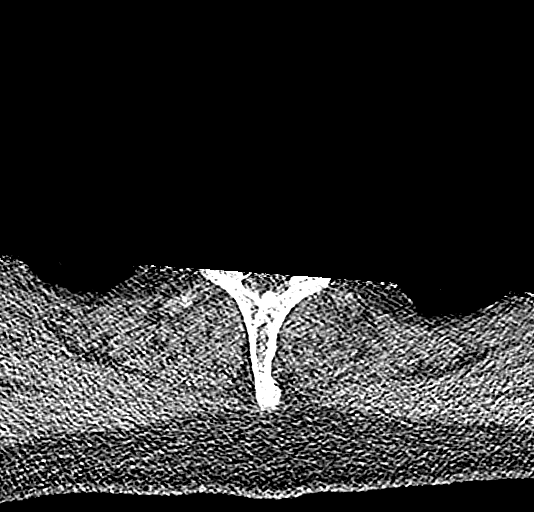
[im 16/112  bone]
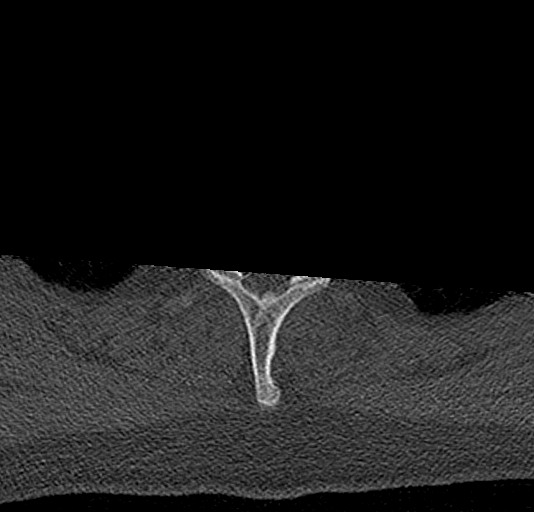
[im 48/112  bone]
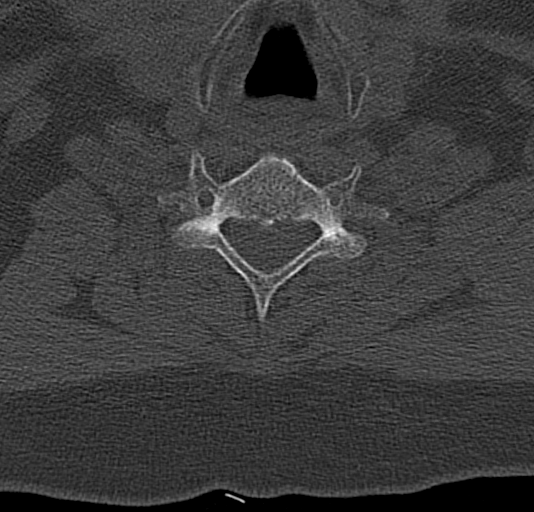
[im 64/112  bone]
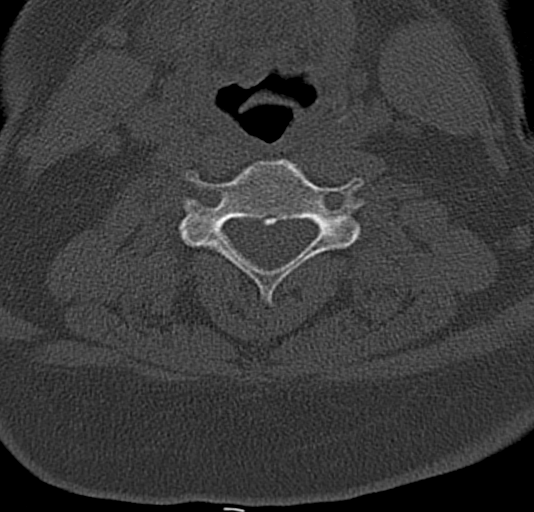
[im 96/112  bone]
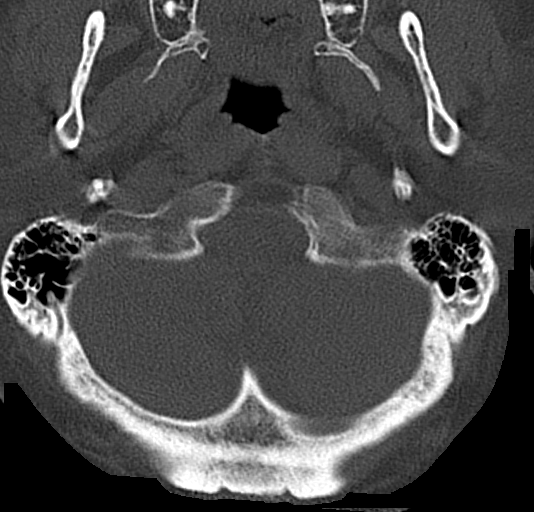

[12 of 33 positions shown; findings below may reference images not displayed]

FINDINGS: CT HEAD FINDINGS

Brain: No evidence of acute infarction, hemorrhage, hydrocephalus,
extra-axial collection or mass lesion/mass effect.

Vascular: No hyperdense vessel or unexpected calcification.

Skull: Normal. Negative for fracture or focal lesion.

Sinuses/Orbits: No acute finding.

Other: Soft tissue laceration is noted just to the right of the
midline in the upper forehead consistent with the given clinical
history.

CT CERVICAL SPINE FINDINGS

Alignment: Mild straightening of the normal cervical lordosis is
noted. This is likely related to muscular spasm.

Skull base and vertebrae: 7 cervical segments are well visualized.
Vertebral body height is well maintained. No acute fracture or acute
facet abnormality is noted. Mild osteophytic changes are noted at
C5-6.

Soft tissues and spinal canal: The surrounding soft tissue
structures are within normal limits.

Upper chest: Visualized lung apices are unremarkable.

Other: None
IMPRESSION: CT of the head: No acute intracranial abnormality noted.

Forehead laceration is noted just to the right of the midline.

CT of the cervical spine: Mild straightening of the normal cervical
lordosis is noted.

Mild degenerative changes are seen.  No acute fracture noted.

## 2021-11-19 MED ORDER — TETANUS-DIPHTH-ACELL PERTUSSIS 5-2.5-18.5 LF-MCG/0.5 IM SUSY
0.5000 mL | PREFILLED_SYRINGE | Freq: Once | INTRAMUSCULAR | Status: AC
Start: 2021-11-19 — End: 2021-11-20
  Administered 2021-11-20: 0.5 mL via INTRAMUSCULAR
  Filled 2021-11-19: qty 0.5

## 2021-11-19 NOTE — ED Triage Notes (Signed)
Fall from standing, hit head on door. Denies LOC. EMS reports apx 4 inch lac to top of forehead. Pressure dressing in place. Pt A&O upon arrival to room.  ?

## 2021-11-19 NOTE — ED Provider Notes (Signed)
[facility] Provider Note    Event Date/Time   First MD Initiated Contact with Patient 11/19/21 2304     (approximate)   History   Fall   HPI  Isaac Hernandez is a 56 y.o. male who presents to the emergency department with EMS after he fell and hit his head while drinking tonight.  States he thinks that he tripped.  No preceding symptoms such as dizziness, chest pain or shortness of breath that led to his fall.  Has a forehead laceration.  No loss of consciousness.  Is on full dose aspirin but denies anticoagulant use.  He denies any headache, neck or back pain, numbness, tingling or weakness, chest or abdominal pain.  Denies any other injuries.  States he is unsure of his last tetanus vaccination.   History provided by patient, EMS, wife and daughter.    History reviewed. No pertinent past medical history.  History reviewed. No pertinent surgical history.  MEDICATIONS:  Prior to Admission medications   Not on File    Physical Exam   Triage Vital Signs: ED Triage Vitals  Enc Vitals Group     BP 11/19/21 2311 (!) 154/91     Pulse Rate 11/19/21 2311 76     Resp 11/19/21 2311 18     Temp 11/19/21 2311 97.8 F (36.6 C)     Temp Source 11/19/21 2311 Oral     SpO2 11/19/21 2311 100 %     Weight 11/19/21 2310 200 lb (90.7 kg)     Height 11/19/21 2310 5\' 11"  (1.803 m)     Head Circumference --      Peak Flow --      Pain Score 11/19/21 2309 3     Pain Loc --      Pain Edu? --      Excl. in [place]? --     Most recent vital signs: Vitals:   11/20/21 0030 11/20/21 0206  BP: 126/81 (!) 154/74  Pulse: 74 75  Resp:  18  Temp:  98.1 F (36.7 C)  SpO2: 97% 100%     CONSTITUTIONAL: Alert and oriented and responds appropriately to questions. Well-appearing; well-nourished; GCS 15 HEAD: Normocephalic; 8 cm forehead laceration EYES: Conjunctivae clear, PERRL, EOMI ENT: normal nose; no rhinorrhea; moist mucous membranes; pharynx without lesions noted;  no dental injury; no septal hematoma, no epistaxis; no facial deformity or bony tenderness NECK: Supple, no midline spinal tenderness, step-off or deformity; trachea midline CARD: RRR; S1 and S2 appreciated; no murmurs, no clicks, no rubs, no gallops RESP: Normal chest excursion without splinting or tachypnea; breath sounds clear and equal bilaterally; no wheezes, no rhonchi, no rales; no hypoxia or respiratory distress CHEST:  chest wall stable, no crepitus or ecchymosis or deformity, nontender to palpation; no flail chest ABD/GI: Normal bowel sounds; non-distended; soft, non-tender, no rebound, no guarding; no ecchymosis or other lesions noted PELVIS:  stable, nontender to palpation BACK:  The back appears normal; no midline spinal tenderness, step-off or deformity EXT: Normal ROM in all joints; non-tender to palpation; no edema; normal capillary refill; no cyanosis, no bony tenderness or bony deformity of patient's extremities, no joint effusion, compartments are soft, extremities are warm and well-perfused, no ecchymosis SKIN: Normal color for age and race; warm NEURO: No facial asymmetry, normal speech, moving all extremities equally, normal sensation diffusely  ED Results / Procedures / Treatments   LABS: (all labs ordered are listed, but only abnormal results are displayed) Labs  Reviewed - No data to display   EKG:    RADIOLOGY: My personal review and interpretation of imaging: CT head and cervical spine show no acute traumatic injury other than forehead laceration.  I have personally reviewed all radiology reports. CT HEAD WO CONTRAST (5MM)  Result Date: 11/20/2021 CLINICAL DATA:  Recent fall with headaches and neck pain, initial encounter EXAM: CT HEAD WITHOUT CONTRAST CT CERVICAL SPINE WITHOUT CONTRAST TECHNIQUE: Multidetector CT imaging of the head and cervical spine was performed following the standard protocol without intravenous contrast. Multiplanar CT image reconstructions  of the cervical spine were also generated. RADIATION DOSE REDUCTION: This exam was performed according to the departmental dose-optimization program which includes automated exposure control, adjustment of the mA and/or kV according to patient size and/or use of iterative reconstruction technique. COMPARISON:  None. FINDINGS: CT HEAD FINDINGS Brain: No evidence of acute infarction, hemorrhage, hydrocephalus, extra-axial collection or mass lesion/mass effect. Vascular: No hyperdense vessel or unexpected calcification. Skull: Normal. Negative for fracture or focal lesion. Sinuses/Orbits: No acute finding. Other: Soft tissue laceration is noted just to the right of the midline in the upper forehead consistent with the given clinical history. CT CERVICAL SPINE FINDINGS Alignment: Mild straightening of the normal cervical lordosis is noted. This is likely related to muscular spasm. Skull base and vertebrae: 7 cervical segments are well visualized. Vertebral body height is well maintained. No acute fracture or acute facet abnormality is noted. Mild osteophytic changes are noted at C5-6. Soft tissues and spinal canal: The surrounding soft tissue structures are within normal limits. Upper chest: Visualized lung apices are unremarkable. Other: None IMPRESSION: CT of the head: No acute intracranial abnormality noted. Forehead laceration is noted just to the right of the midline. CT of the cervical spine: Mild straightening of the normal cervical lordosis is noted. Mild degenerative changes are seen.  No acute fracture noted. Electronically Signed   By: Inez Catalina M.D.   On: 11/20/2021 00:08   CT Cervical Spine Wo Contrast  Result Date: 11/20/2021 CLINICAL DATA:  Recent fall with headaches and neck pain, initial encounter EXAM: CT HEAD WITHOUT CONTRAST CT CERVICAL SPINE WITHOUT CONTRAST TECHNIQUE: Multidetector CT imaging of the head and cervical spine was performed following the standard protocol without intravenous  contrast. Multiplanar CT image reconstructions of the cervical spine were also generated. RADIATION DOSE REDUCTION: This exam was performed according to the departmental dose-optimization program which includes automated exposure control, adjustment of the mA and/or kV according to patient size and/or use of iterative reconstruction technique. COMPARISON:  None. FINDINGS: CT HEAD FINDINGS Brain: No evidence of acute infarction, hemorrhage, hydrocephalus, extra-axial collection or mass lesion/mass effect. Vascular: No hyperdense vessel or unexpected calcification. Skull: Normal. Negative for fracture or focal lesion. Sinuses/Orbits: No acute finding. Other: Soft tissue laceration is noted just to the right of the midline in the upper forehead consistent with the given clinical history. CT CERVICAL SPINE FINDINGS Alignment: Mild straightening of the normal cervical lordosis is noted. This is likely related to muscular spasm. Skull base and vertebrae: 7 cervical segments are well visualized. Vertebral body height is well maintained. No acute fracture or acute facet abnormality is noted. Mild osteophytic changes are noted at C5-6. Soft tissues and spinal canal: The surrounding soft tissue structures are within normal limits. Upper chest: Visualized lung apices are unremarkable. Other: None IMPRESSION: CT of the head: No acute intracranial abnormality noted. Forehead laceration is noted just to the right of the midline. CT of the cervical spine: Mild  straightening of the normal cervical lordosis is noted. Mild degenerative changes are seen.  No acute fracture noted. Electronically Signed   By: Inez Catalina M.D.   On: 11/20/2021 00:08     PROCEDURES:  Critical Care performed: No   CRITICAL CARE Performed by: Cyril Mourning Stefanny Pieri   Total critical care time: 0 minutes  Critical care time was exclusive of separately billable procedures and treating other patients.  Critical care was necessary to treat or prevent  imminent or life-threatening deterioration.  Critical care was time spent personally by me on the following activities: development of treatment plan with patient and/or surrogate as well as nursing, discussions with consultants, evaluation of patient's response to treatment, examination of patient, obtaining history from patient or surrogate, ordering and performing treatments and interventions, ordering and review of laboratory studies, ordering and review of radiographic studies, pulse oximetry and re-evaluation of patient's condition.   LACERATION REPAIR Performed by: Pryor Curia Authorized by: Pryor Curia Consent: Verbal consent obtained. Risks and benefits: risks, benefits and alternatives were discussed Consent given by: patient Patient identity confirmed: provided demographic data Prepped and Draped in normal sterile fashion Wound explored  Laceration Location: Forehead  Laceration Length: 8 cm  No Foreign Bodies seen or palpated  Anesthesia: local infiltration  Local anesthetic: lidocaine 2% with epinephrine  Anesthetic total: 8 ml  Irrigation method: syringe Amount of cleaning: standard  Skin closure: Superficial  Number of sutures: 15  Technique: Area anesthetized using lidocaine 2% with epinephrine. Wound irrigated copiously with sterile saline. Wound then cleaned with Betadine and draped in sterile fashion. Wound closed using 15 simple interrupted sutures with 4-0 Monocryl.  Bacitracin and sterile dressing applied. Good wound approximation and hemostasis achieved.   Patient tolerance: Patient tolerated the procedure well with no immediate complications.      Procedures    IMPRESSION / MDM / ASSESSMENT AND PLAN / ED COURSE  I reviewed the triage vital signs and the nursing notes.  Patient here with mechanical fall while intoxicated with forehead laceration.  The patient is on the cardiac monitor to evaluate for evidence of arrhythmia and/or significant  heart rate changes.   DIFFERENTIAL DIAGNOSIS (includes but not limited to):   Forehead laceration, concussion, skull fracture, intracranial hemorrhage, spine cervical spine fracture or dislocation, intoxication   PLAN: We will obtain CT of the head and cervical spine.  We will update his tetanus vaccine.  He denies any pain.  He is neurologically intact and hemodynamically stable.  Went CTs have been obtained, I will need to clean and repair his forehead laceration.   MEDICATIONS GIVEN IN ED: Medications  Tdap (BOOSTRIX) injection 0.5 mL (0.5 mLs Intramuscular Given 11/20/21 0044)  bacitracin ointment ( Topical Given by Other 11/20/21 0049)  lidocaine-EPINEPHrine (XYLOCAINE W/EPI) 2 %-1:100000 (with pres) injection 20 mL (20 mLs Intradermal Given by Other 11/20/21 0049)     ED COURSE: CT head and cervical spine reviewed by myself and radiologist and shows no intracranial hemorrhage, fractures or dislocation.  Laceration repaired without any difficulty and was well approximated and hemodynamically stable.  Patient does not appear clinically intoxicated at this time.  No focal neurologic deficits.  No complaints of pain.  Hemodynamically stable.  Discussed wound care instructions and head injury return precautions with patient and wife.  Wife will take him home.  Recommended over-the-counter Tylenol, Motrin as needed.  Offered Motrin here which patient declines.   At this time, I do not feel there is any life-threatening condition present. I  reviewed all nursing notes, vitals, pertinent previous records.  All lab and urine results, EKGs, imaging ordered have been independently reviewed and interpreted by myself.  I reviewed all available radiology reports from any imaging ordered this visit.  Based on my assessment, I feel the patient is safe to be discharged home without further emergent workup and can continue workup as an outpatient as needed. Discussed all findings, treatment plan as well as usual  and customary return precautions with patient and wife.  They verbalize understanding and are comfortable with this plan.  Outpatient follow-up has been provided as needed.  All questions have been answered.    CONSULTS: No admission needed at this time given reassuring work-up, patient hemodynamically stable and neurologically intact.   OUTSIDE RECORDS REVIEWED: Reviewed last office visit with Herbie Baltimore to me on 08/08/2021.         FINAL CLINICAL IMPRESSION(S) / ED DIAGNOSES   Final diagnoses:  Injury of head, initial encounter  Forehead laceration, initial encounter     Rx / DC Orders   ED Discharge Orders     None        Note:  This document was prepared using Dragon voice recognition software and may include unintentional dictation errors.   Jehu Mccauslin, Delice Bison, DO 11/20/21 [phone]

## 2021-11-20 MED ORDER — LIDOCAINE-EPINEPHRINE 2 %-1:100000 IJ SOLN
20.0000 mL | Freq: Once | INTRAMUSCULAR | Status: AC
  Administered 2021-11-20: 20 mL via INTRADERMAL
  Filled 2021-11-20: qty 1

## 2021-11-20 MED ORDER — BACITRACIN ZINC 500 UNIT/GM EX OINT
TOPICAL_OINTMENT | Freq: Once | CUTANEOUS | Status: AC
  Filled 2021-11-20: qty 2.7

## 2021-11-20 NOTE — ED Notes (Signed)
Pt was assisted to the bathroom, pt ambulated with a little of unevenness. Overall can be ambulated with stand by assist  ?

## 2021-11-20 NOTE — Discharge Instructions (Signed)
You may alternate Tylenol 1000 mg every 6 hours as needed for pain, fever and Ibuprofen 800 mg every 6-8 hours as needed for pain, fever.  Please take Ibuprofen with food.  Do not take more than 4000 mg of Tylenol (acetaminophen) in a 24 hour period. ? ? ?You have had a head injury resulting in a concussion.  A concussion is a clinical diagnosis and not seen on imaging (CT, MRI).  Please avoid alcohol, sedatives for the next week.  Please rest and drink plenty of water.  We recommend that you avoid any activity that may lead to another head injury for at least 1 week or until your symptoms have completely resolved.  We also recommend "brain rest" to ensure the best possible long term outcomes - please avoid TV, cell phones, tablets, computers as much as possible for the next 48 hours.   ? ?

## 2021-11-29 ENCOUNTER — Other Ambulatory Visit: Payer: Self-pay | Admitting: Neurology

## 2021-11-29 ENCOUNTER — Other Ambulatory Visit (HOSPITAL_COMMUNITY): Payer: Self-pay | Admitting: Neurology

## 2021-11-29 DIAGNOSIS — R413 Other amnesia: Secondary | ICD-10-CM

## 2021-12-12 ENCOUNTER — Ambulatory Visit (HOSPITAL_COMMUNITY): Payer: BC Managed Care – PPO

## 2021-12-12 ENCOUNTER — Encounter (HOSPITAL_COMMUNITY): Payer: Self-pay

## 2022-01-09 ENCOUNTER — Encounter: Admission: RE | Payer: Self-pay | Source: Home / Self Care

## 2022-01-09 ENCOUNTER — Ambulatory Visit
Admission: RE | Admit: 2022-01-09 | Payer: BC Managed Care – PPO | Source: Home / Self Care | Admitting: Internal Medicine

## 2022-01-09 SURGERY — COLONOSCOPY
Anesthesia: General

## 2022-01-25 ENCOUNTER — Other Ambulatory Visit: Payer: Self-pay | Admitting: Family Medicine

## 2022-01-25 DIAGNOSIS — M5412 Radiculopathy, cervical region: Secondary | ICD-10-CM

## 2022-01-30 ENCOUNTER — Other Ambulatory Visit: Payer: Self-pay | Admitting: Neurology

## 2022-01-30 ENCOUNTER — Ambulatory Visit (HOSPITAL_COMMUNITY)
Admission: RE | Admit: 2022-01-30 | Discharge: 2022-01-30 | Disposition: A | Discharge status: Home / Self Care | Payer: BC Managed Care – PPO | Source: Ambulatory Visit | Attending: Neurology | Admitting: Neurology

## 2022-01-30 DIAGNOSIS — R413 Other amnesia: Secondary | ICD-10-CM

## 2022-01-30 IMAGING — MR MR HEAD W/O CM
12 of 16 series · 23 of 48 positions shown · non-contrast
Comparison: CT head [DATE].

CLINICAL DATA: Mild memory loss.

EXAM:
MRI HEAD WITHOUT CONTRAST
TECHNIQUE: Multiplanar, multiecho pulse sequences of the brain and surrounding
structures were obtained without intravenous contrast.
Additionally, using NeuroQuant software a 3D volumetric analysis of
the brain was performed and is compared to a normative database
adjusted for age, gender and intracranial volume.

[Series 3: DWI · axial · 3.0mm · 0.94mm/px · z∈[-98,+48]mm · 2 of 100 slices shown (1 of 2)]
[im 1/100]
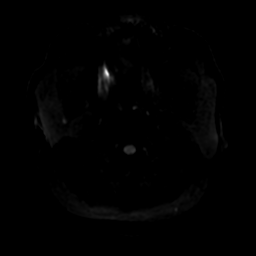
[im 100/100]
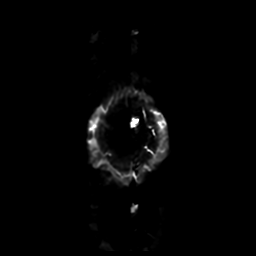

[Series 4: DWI · coronal · 4.0mm · 0.94mm/px · 1 of 68 slices shown (2 of 2)]
[im 1/68]
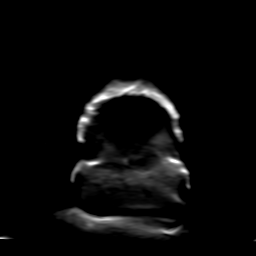

[Series 6: FLAIR · sagittal · 5.0mm · 0.23mm/px · 1 of 26 slices shown (1 of 2)]
[im 1/26]
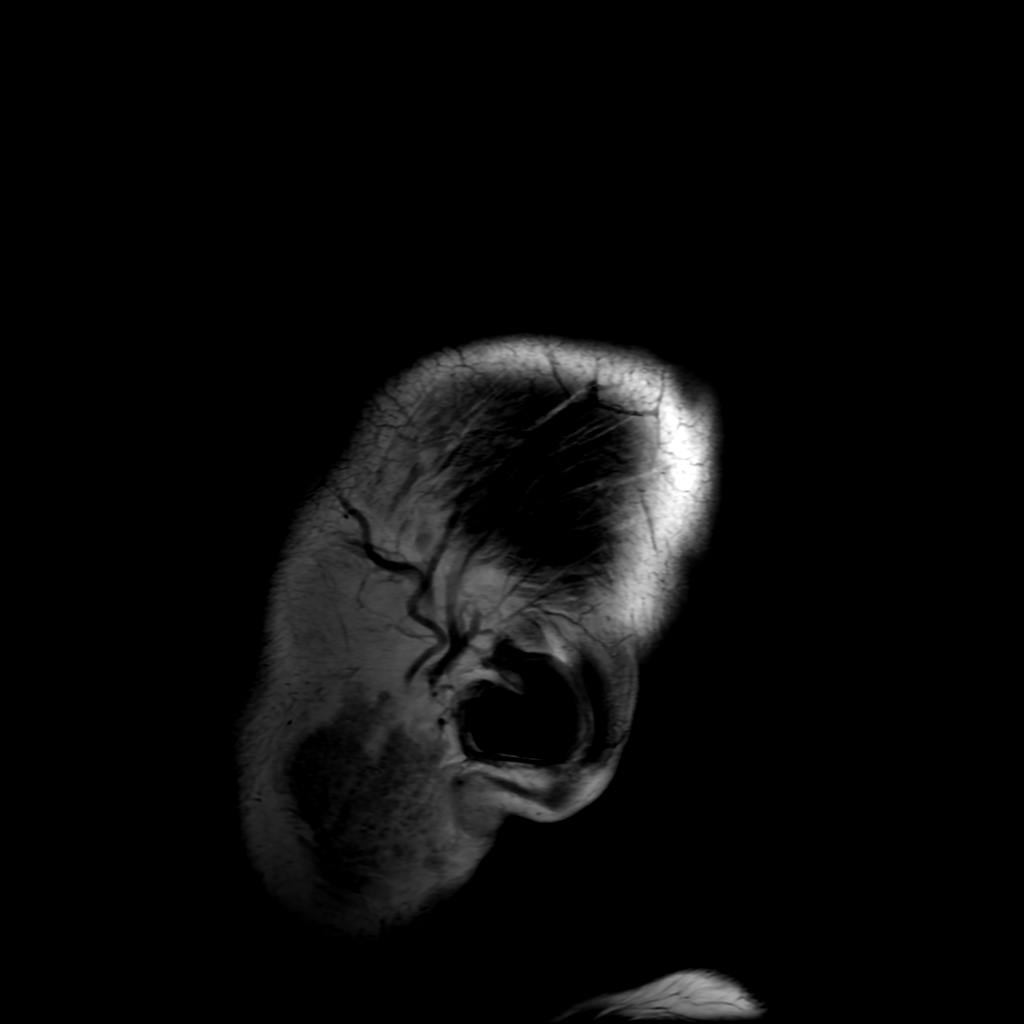

[Series 7: T2 · axial · 5.0mm · 0.23mm/px · 1 of 25 slices shown (1 of 2)]
[im 1/25]
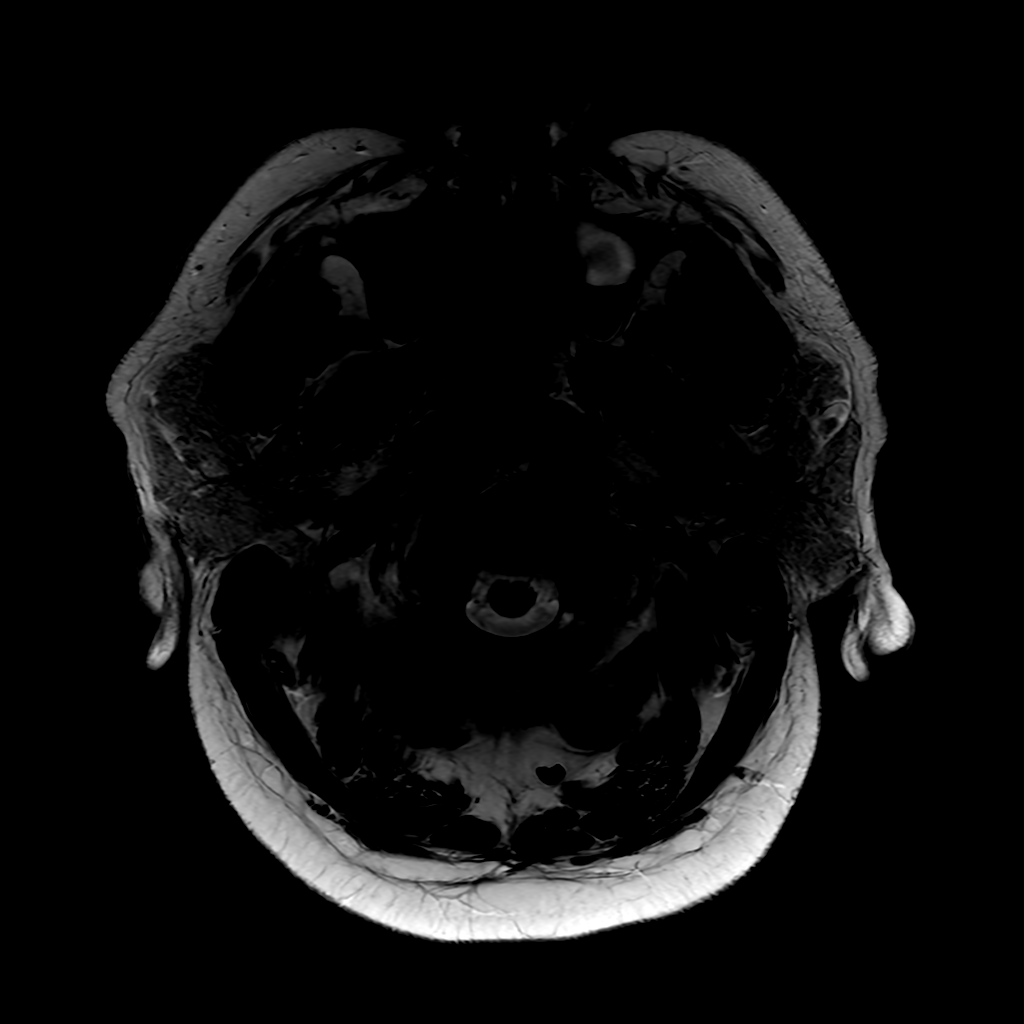

[Series 8: FLAIR · axial · 4.0mm · 0.45mm/px · 1 of 34 slices shown (2 of 2)]
[im 1/34]
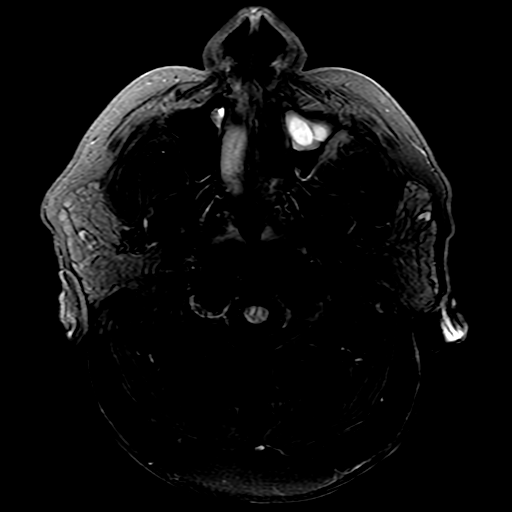

[Series 9: (person_name) · axial · 3.0mm · 0.47mm/px · z∈[-82,+63]mm · 2 of 100 slices shown]
[im 1/100]
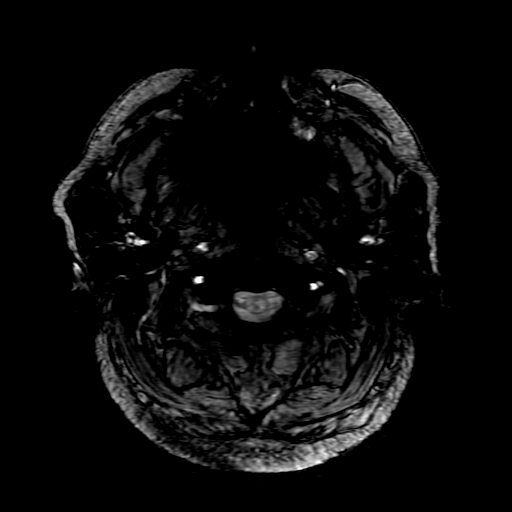
[im 100/100]
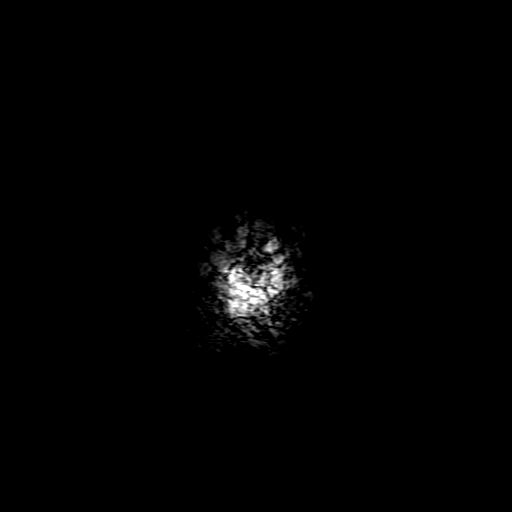

[Series 10: ax 3(person_name) · axial · 3.0mm · 0.94mm/px · 1 of 52 slices shown]
[im 1/52]
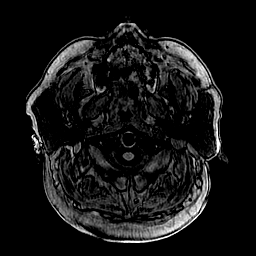

[Series 11: T2 · coronal · 5.0mm · 0.20mm/px · 1 of 28 slices shown (2 of 2)]
[im 1/28]
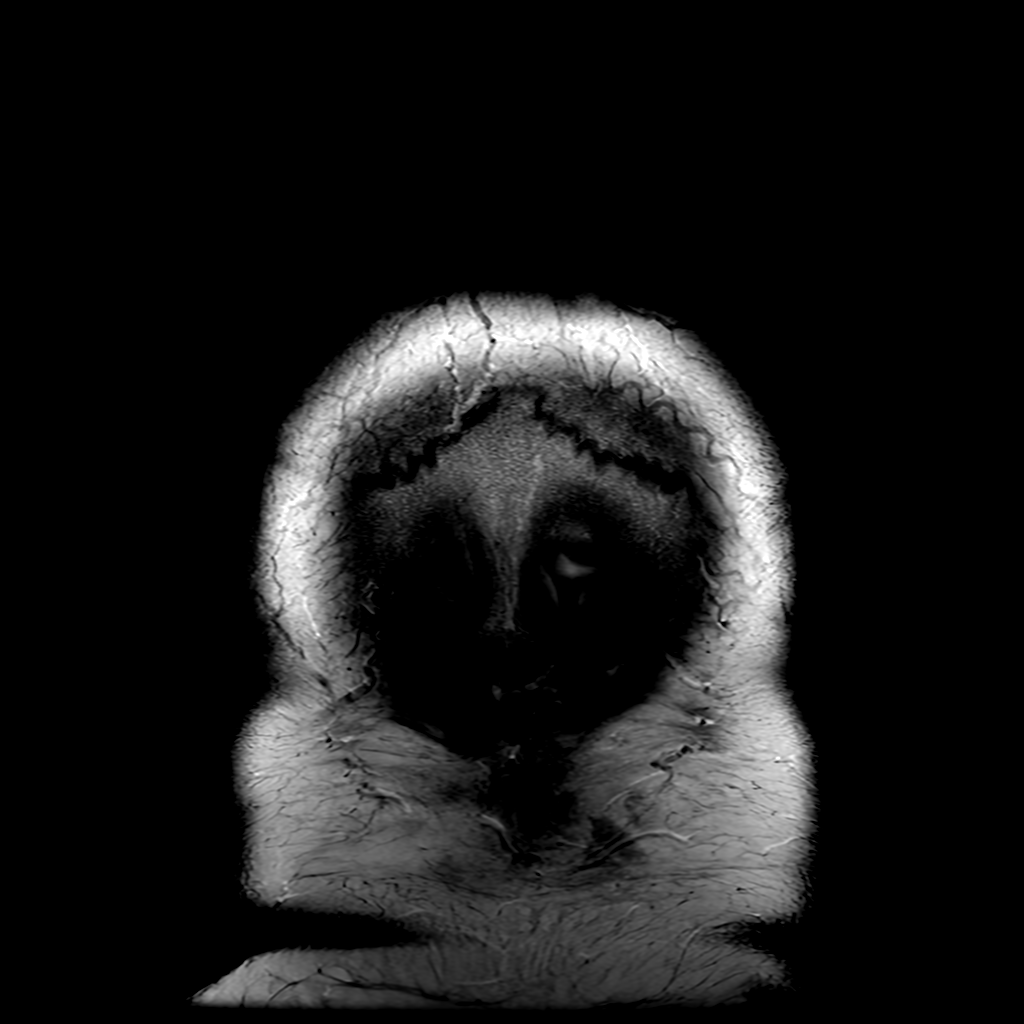

[Series 52: nqsegcorsc · 1.00mm/px · 6 of 224 slices shown (1 of 2)]
[im 1/224]
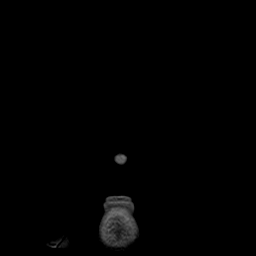
[im 45/224]
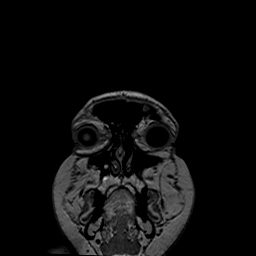
[im 90/224]
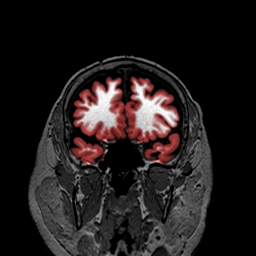
[im 134/224]
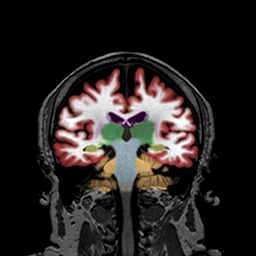
[im 179/224]
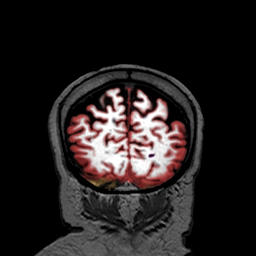
[im 224/224]
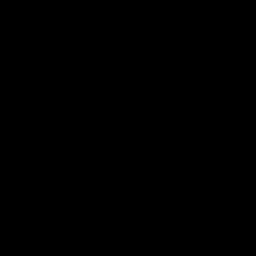

[Series 52: nqsegcorsc · 1.00mm/px · 5 of 224 slices shown (2 of 2)]
[im 1/224]
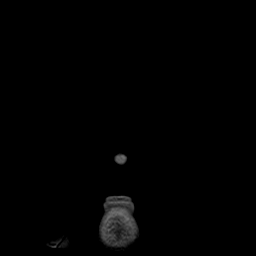
[im 45/224]
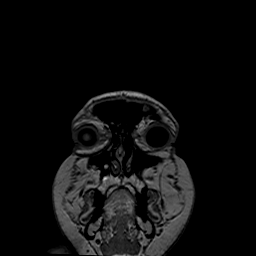
[im 90/224]
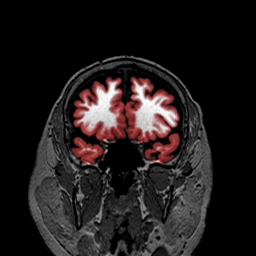
[im 134/224]
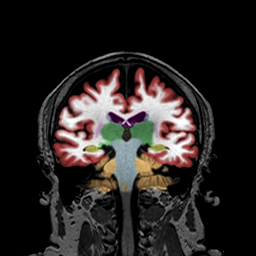
[im 179/224]
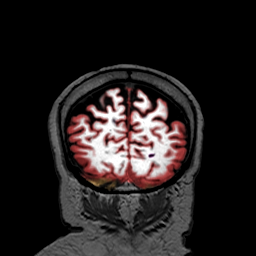

[Series 350: ADC · axial · 3.0mm · 0.94mm/px · 1 of 50 slices shown (1 of 2)]
[im 1/50]
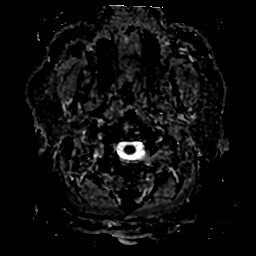

[Series 450: ADC · coronal · 4.0mm · 0.94mm/px · 1 of 34 slices shown (2 of 2)]
[im 1/34]
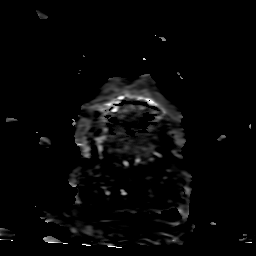

[23 of 48 positions shown; findings below may reference images not displayed]

FINDINGS: Brain: No acute infarction, hemorrhage, hydrocephalus, extra-axial
collection or mass lesion.

Vascular: Major arterial flow voids are maintained at the skull
base.

Skull and upper cervical spine: Normal marrow signal.

Sinuses/Orbits: Left maxillary sinus retention cyst. No acute
orbital findings.

Other: No mastoid effusions.

NeuroQuant Findings:

Volumetric analysis of the brain was performed, with a fully
detailed report in [HOSPITAL] PACS. Briefly, the comparison with age and
gender matched reference reveals normative whole brain volume
normative percentile of 1. Cortical gray matter normative percentile
of 3 and cerebral white matter normative percentile of 29.
Hippocampi normative percentile of 94.
IMPRESSION: 1. No evidence of acute intracranial abnormality.
2. NeuroQuant volumetric analysis of the brain, see details on
[HOSPITAL] PACS.

## 2022-01-31 ENCOUNTER — Ambulatory Visit
Admission: RE | Admit: 2022-01-31 | Discharge: 2022-01-31 | Disposition: A | Discharge status: Home / Self Care | Payer: BC Managed Care – PPO | Source: Ambulatory Visit | Attending: Family Medicine | Admitting: Family Medicine

## 2022-01-31 DIAGNOSIS — M5412 Radiculopathy, cervical region: Secondary | ICD-10-CM

## 2022-01-31 IMAGING — MR MR CERVICAL SPINE W/O CM
4 of 5 series · 28 of 48 positions shown · non-contrast
Comparison: None Available.

CLINICAL DATA: Right shoulder and arm

EXAM:
MRI CERVICAL SPINE WITHOUT CONTRAST
TECHNIQUE: Multiplanar, multisequence MR imaging of the cervical spine was
performed. No intravenous contrast was administered.

[Series 6: T1 · sagittal · 3.0mm · 0.66mm/px · 7 of 15 slices shown]
[im 1/15]
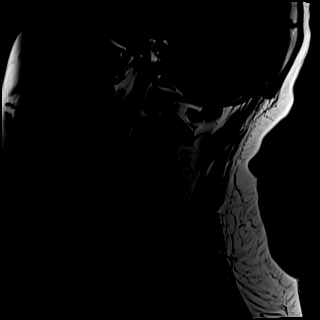
[im 3/15]
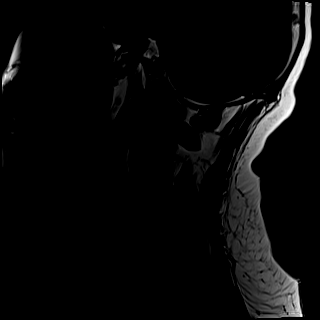
[im 5/15]
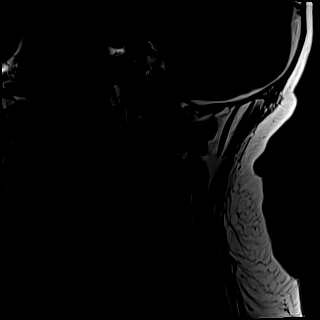
[im 8/15]
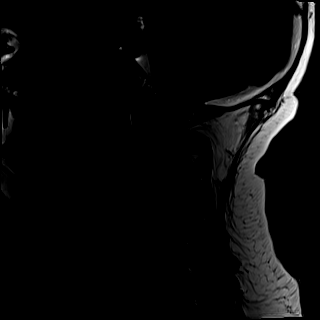
[im 10/15]
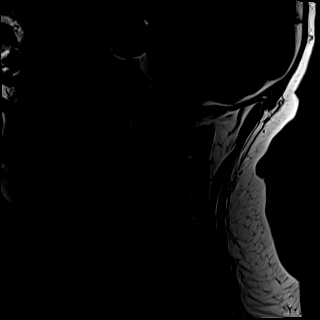
[im 12/15]
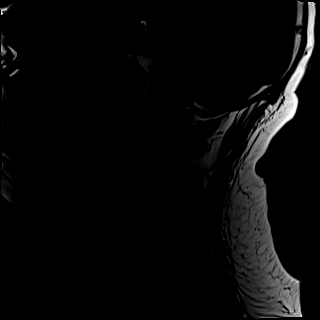
[im 15/15]
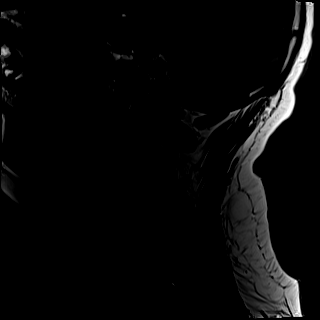

[Series 7: T2 · sagittal · 3.0mm · 0.55mm/px · 7 of 15 slices shown (1 of 2)]
[im 1/15]
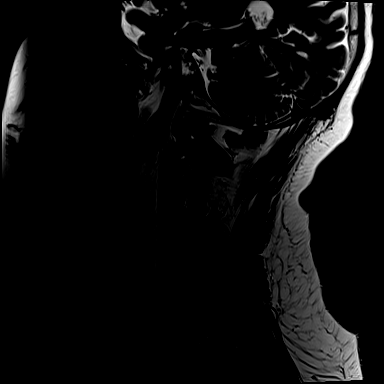
[im 3/15]
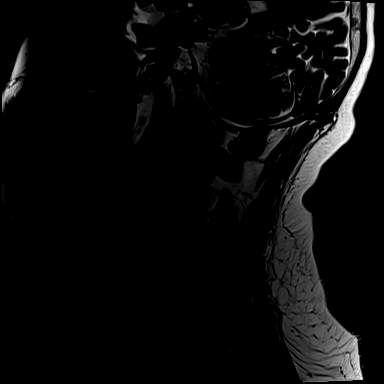
[im 5/15]
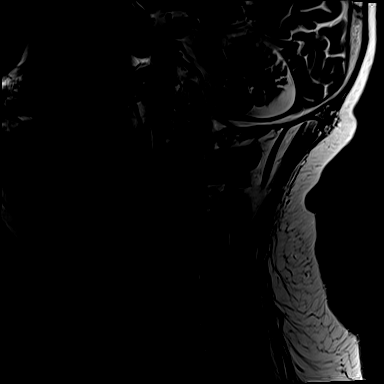
[im 8/15]
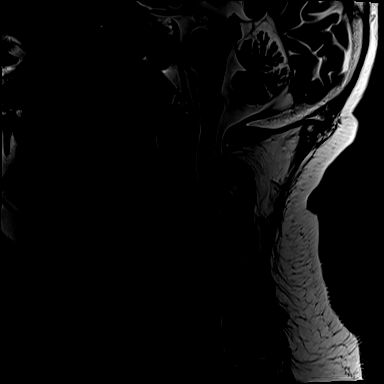
[im 10/15]
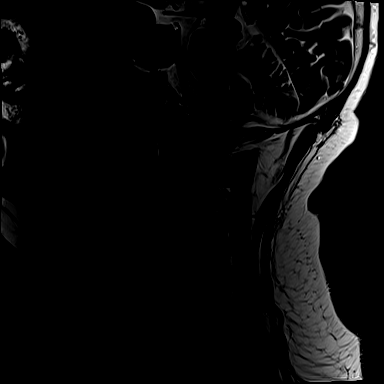
[im 12/15]
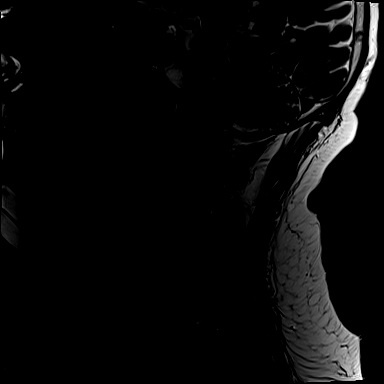
[im 15/15]
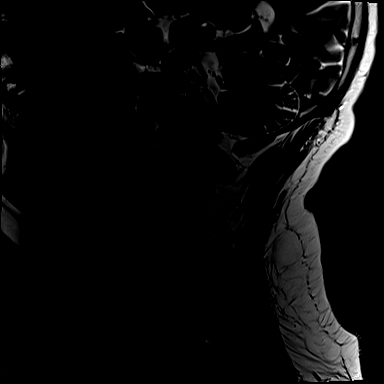

[Series 8: STIR · sagittal · 3.0mm · 0.33mm/px · 6 of 15 slices shown]
[im 1/15]
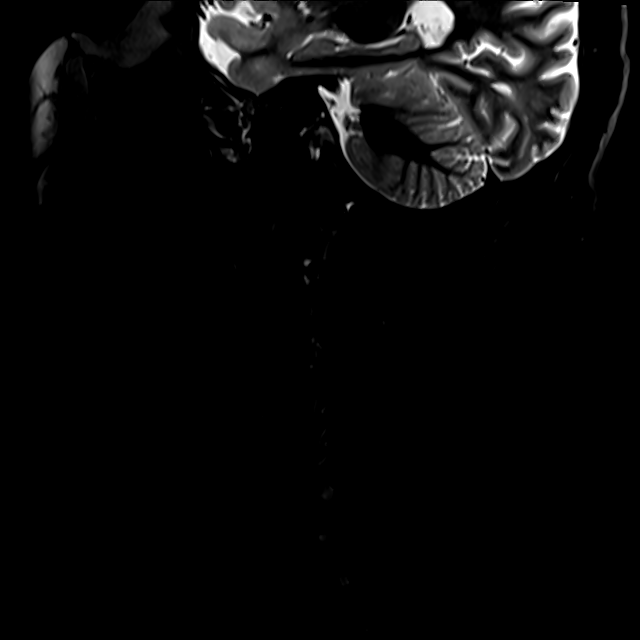
[im 3/15]
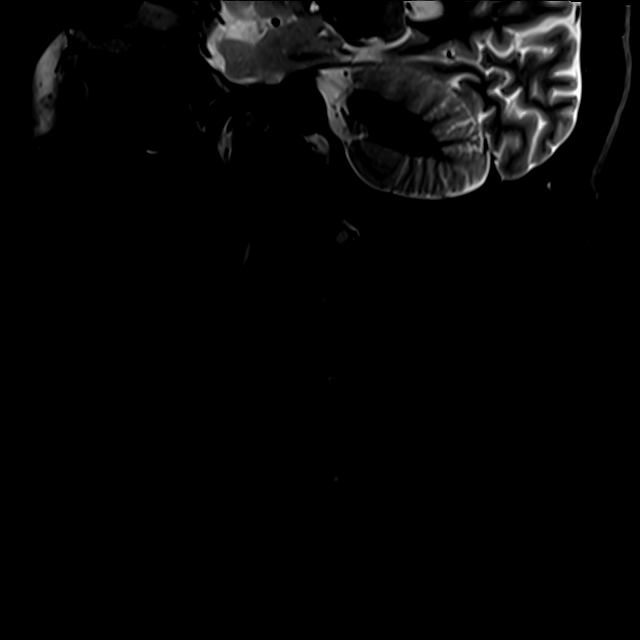
[im 6/15]
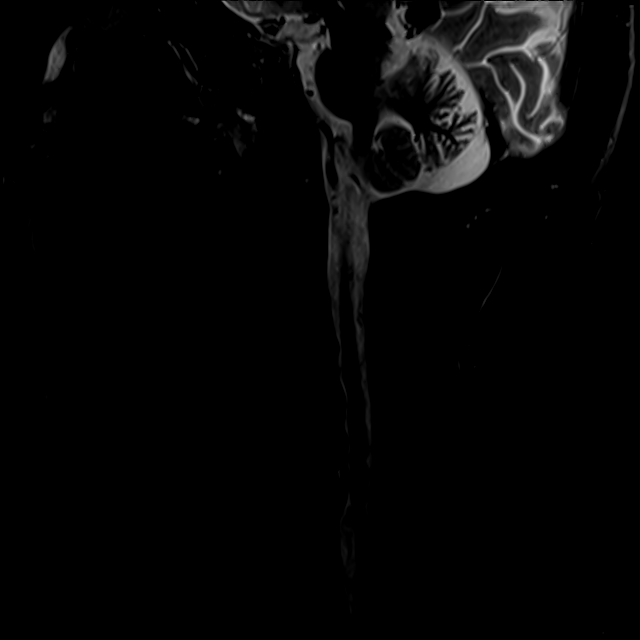
[im 9/15]
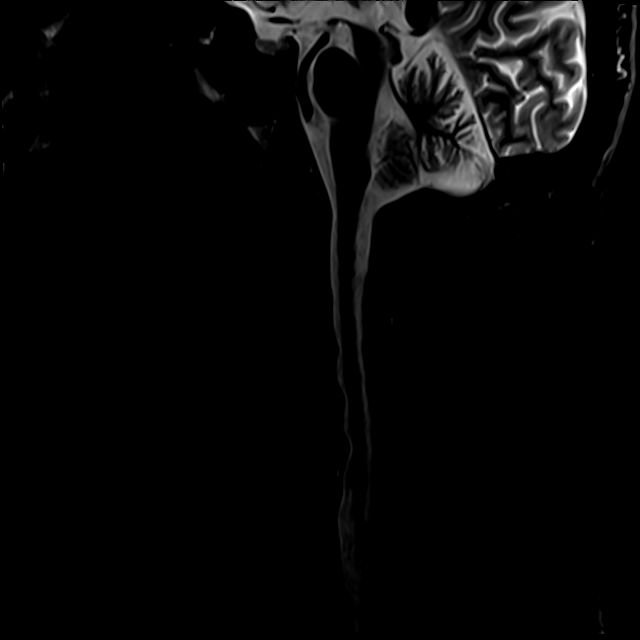
[im 12/15]
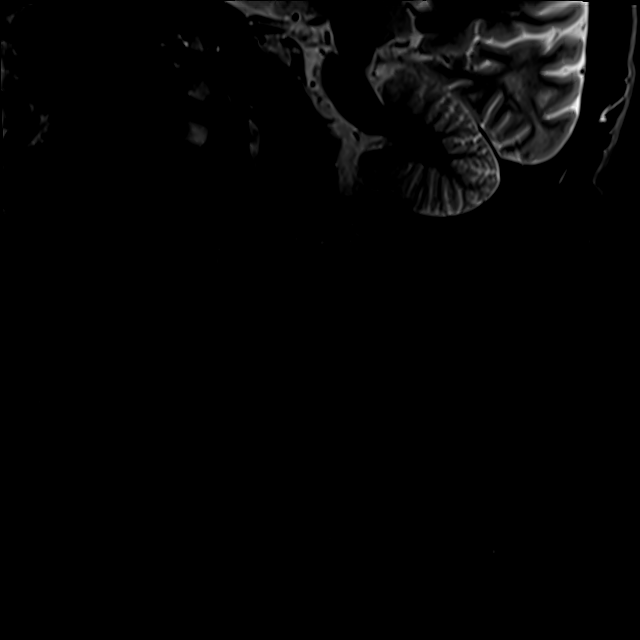
[im 15/15]
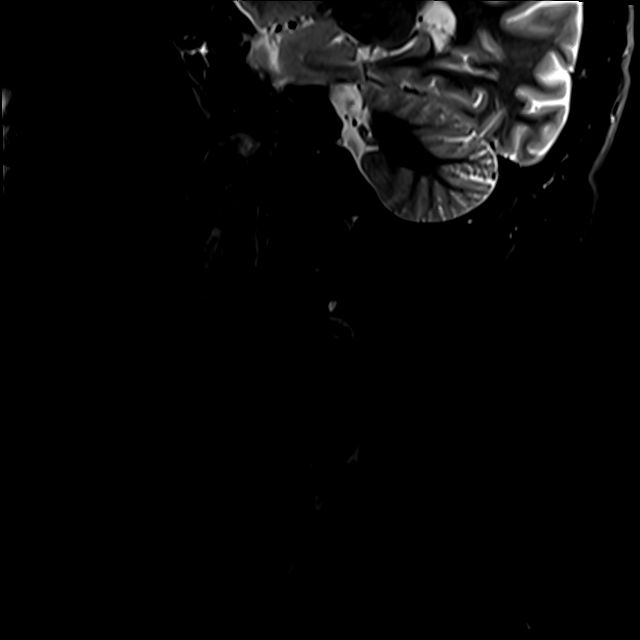

[Series 9: T2 · axial · 3.0mm · 0.50mm/px · z∈[-56,+42]mm · 8 of 33 slices shown (2 of 2)]
[im 1/33]
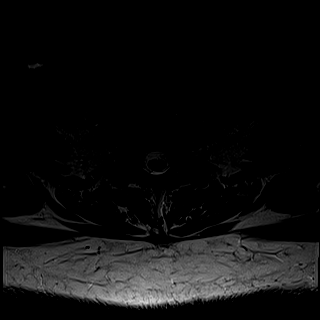
[im 5/33]
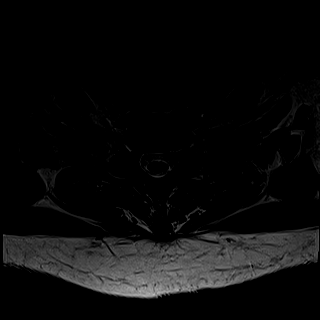
[im 10/33]
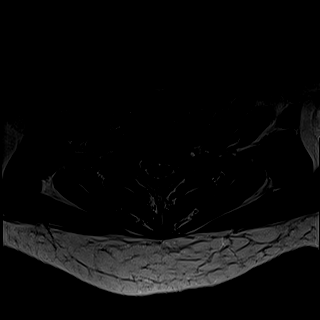
[im 15/33]
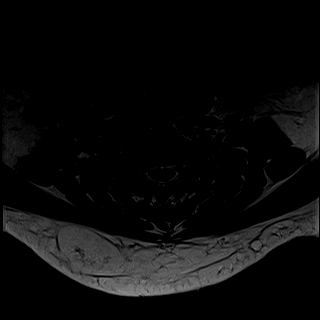
[im 18/33]
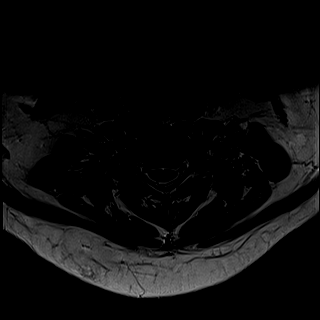
[im 23/33]
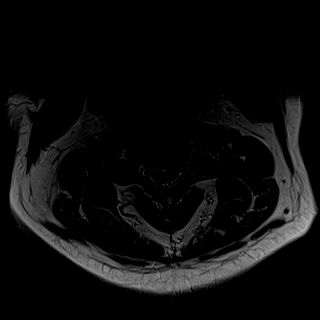
[im 28/33]
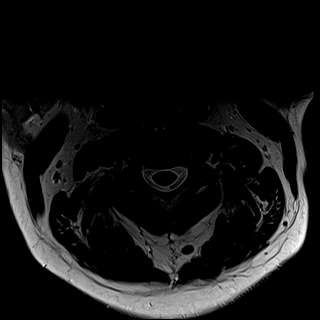
[im 33/33]
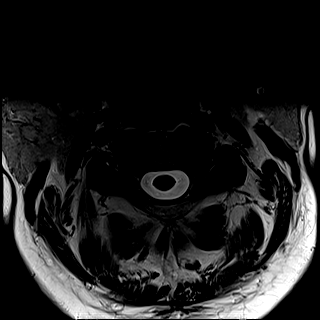

[28 of 48 positions shown; findings below may reference images not displayed]

FINDINGS: Alignment: Physiologic.

Vertebrae: No acute fracture, evidence of discitis, or aggressive
bone lesion.

Cord: Normal signal and morphology.

Posterior Fossa, vertebral arteries, paraspinal tissues: Posterior
fossa demonstrates no focal abnormality. Vertebral artery flow voids
are maintained. Paraspinal soft tissues are unremarkable.

Disc levels:

Discs: Degenerative disease with mild disc height loss at C5-6 and
C6-7.

C2-3: No significant disc bulge. No neural foraminal stenosis. No
central canal stenosis.

C3-4: Minimal broad-based disc bulge. No foraminal or central canal
stenosis.

C4-5: Mild broad-based disc bulge. No foraminal or central canal
stenosis.

C5-6: Mild broad-based disc bulge. Bilateral uncovertebral
degenerative changes. Moderate right and mild left foraminal
stenosis. No spinal stenosis.

C6-7: Broad-based disc bulge with a small left paracentral disc
protrusion and right foraminal disc protrusion. Moderate-severe
right foraminal stenosis. Moderate left foraminal stenosis. No
spinal stenosis.

C7-T1: No significant disc bulge. No neural foraminal stenosis. No
central canal stenosis.
IMPRESSION: 1. At C6-7 there is a broad-based disc bulge with a small left
paracentral disc protrusion and right foraminal disc protrusion.
Moderate-severe right foraminal stenosis. Moderate left foraminal
stenosis.
2. At C5-6 there is a mild broad-based disc bulge. Bilateral
uncovertebral degenerative changes. Moderate right and mild left
foraminal stenosis.

## 2022-02-07 ENCOUNTER — Other Ambulatory Visit: Payer: Self-pay | Admitting: Family Medicine

## 2022-02-07 DIAGNOSIS — M5412 Radiculopathy, cervical region: Secondary | ICD-10-CM

## 2022-02-14 ENCOUNTER — Ambulatory Visit
Admission: RE | Admit: 2022-02-14 | Discharge: 2022-02-14 | Disposition: A | Discharge status: Home / Self Care | Payer: BC Managed Care – PPO | Source: Ambulatory Visit | Attending: Family Medicine | Admitting: Family Medicine

## 2022-02-14 DIAGNOSIS — M5412 Radiculopathy, cervical region: Secondary | ICD-10-CM

## 2022-02-14 IMAGING — XA DG INJECT/[PERSON_NAME] INC NEEDLE/CATH/PLC EPI/CERV/THOR W/IMG
2 series · 2 of 2 positions shown · non-contrast
Comparison: none

CLINICAL DATA: Cervical radiculitis.  Neck and right arm pain.

[Series 1: ortho standard · 1 of 1 slices shown (1 of 2)]
[im 1/1]
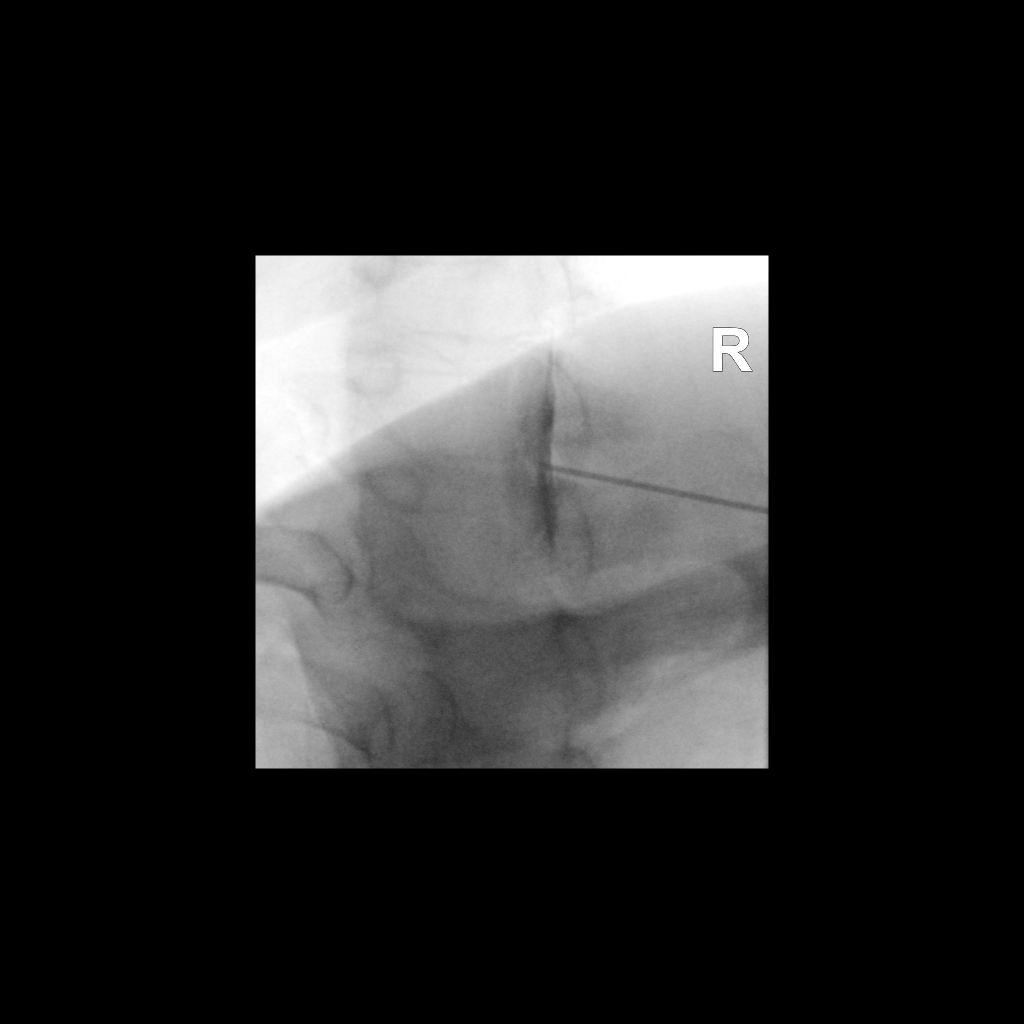

[Series 2: ortho standard · 1 of 1 slices shown (2 of 2)]
[im 1/1]
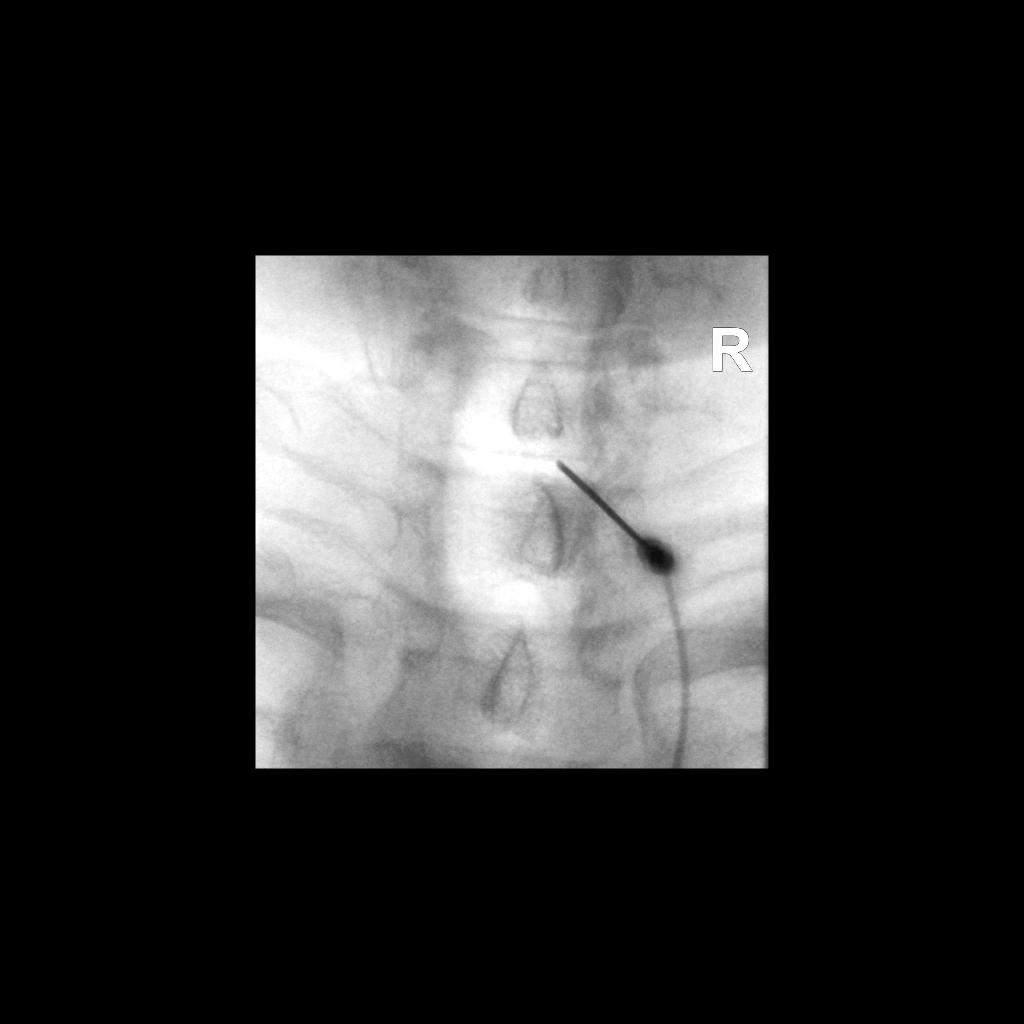

[2 of 2 positions shown; findings below may reference images not displayed]

FLUOROSCOPY:
Radiation Exposure Index (as provided by the fluoroscopic device): 0
minutes 33 seconds. 20.69 micro gray meter squared

PROCEDURE:
CERVICAL EPIDURAL INJECTION

An interlaminar approach was performed on the right at C7-T1. A 20
gauge epidural needle was advanced using loss-of-resistance
technique.

DIAGNOSTIC EPIDURAL INJECTION

Injection of Isovue-M 300 shows a good epidural pattern with spread
above and below the level of needle placement, primarily on the
right. No vascular opacification is seen. THERAPEUTIC

EPIDURAL INJECTION

1.5 ml of Kenalog 40 mixed with 1 ml of 1% Lidocaine and 2 ml of
normal saline were then instilled. The procedure was well-tolerated,
and the patient was discharged thirty minutes following the
injection in good condition.
IMPRESSION: Technically successful initial epidural injection on the right at
C7-T1.

## 2022-02-14 MED ORDER — TRIAMCINOLONE ACETONIDE 40 MG/ML IJ SUSP (RADIOLOGY)
60.0000 mg | Freq: Once | INTRAMUSCULAR | Status: AC
  Administered 2022-02-14: 60 mg via EPIDURAL

## 2022-02-14 MED ORDER — IOPAMIDOL (ISOVUE-M 300) INJECTION 61%
1.0000 mL | Freq: Once | INTRAMUSCULAR | Status: AC
  Administered 2022-02-14: 1 mL via EPIDURAL

## 2022-02-14 NOTE — Discharge Instructions (Signed)

## 2022-10-11 ENCOUNTER — Telehealth: Payer: Self-pay

## 2022-10-11 NOTE — Telephone Encounter (Signed)
Call to number and name given. Explained who I was and calling from and asked for Isaac Hernandez. Person who answered would not give name and asked what call was in regards to. Explained name given and we were doing a contact investigation. He asked for name of person. RN declined to give it. RN went to verify nu,ber and person states you have the wrong number and hung up. Servando Salina, RN'

## 2022-11-13 ENCOUNTER — Other Ambulatory Visit: Payer: Self-pay | Admitting: Family Medicine

## 2022-11-13 DIAGNOSIS — M542 Cervicalgia: Secondary | ICD-10-CM

## 2022-11-20 ENCOUNTER — Ambulatory Visit
Admission: RE | Admit: 2022-11-20 | Discharge: 2022-11-20 | Disposition: A | Discharge status: Home / Self Care | Payer: BC Managed Care – PPO | Source: Ambulatory Visit | Attending: Family Medicine | Admitting: Family Medicine

## 2022-11-20 DIAGNOSIS — M542 Cervicalgia: Secondary | ICD-10-CM

## 2022-11-20 MED ORDER — TRIAMCINOLONE ACETONIDE 40 MG/ML IJ SUSP (RADIOLOGY)
60.0000 mg | Freq: Once | INTRAMUSCULAR | Status: AC
  Administered 2022-11-20: 60 mg via EPIDURAL

## 2022-11-20 MED ORDER — IOPAMIDOL (ISOVUE-M 300) INJECTION 61%
1.0000 mL | Freq: Once | INTRAMUSCULAR | Status: AC | PRN
  Administered 2022-11-20: 1 mL via EPIDURAL

## 2022-11-20 NOTE — Discharge Instructions (Signed)

## 2023-08-03 ENCOUNTER — Telehealth: Payer: Self-pay

## 2023-08-03 NOTE — Telephone Encounter (Signed)
Patient called and wants a copy of all his lab results mailed to him.
